# Patient Record
Sex: Female | Born: 1985 | Race: White | Hispanic: No | Marital: Married | Smoking: Never smoker
Health system: Southern US, Community
[De-identification: ages and names within clinical notes are randomized; demographics above are authoritative.]

## PROBLEM LIST (undated history)

## (undated) ENCOUNTER — Inpatient Hospital Stay (HOSPITAL_COMMUNITY): Payer: Self-pay

## (undated) DIAGNOSIS — Z789 Other specified health status: Secondary | ICD-10-CM

## (undated) DIAGNOSIS — Z8619 Personal history of other infectious and parasitic diseases: Secondary | ICD-10-CM

## (undated) HISTORY — PX: NO PAST SURGERIES: SHX2092

## (undated) HISTORY — DX: Personal history of other infectious and parasitic diseases: Z86.19

---

## 2007-09-11 ENCOUNTER — Emergency Department (HOSPITAL_COMMUNITY): Admission: EM | Admit: 2007-09-11 | Discharge: 2007-09-12 | Payer: Self-pay | Admitting: Emergency Medicine

## 2008-03-07 ENCOUNTER — Emergency Department (HOSPITAL_COMMUNITY): Admission: EM | Admit: 2008-03-07 | Discharge: 2008-03-07 | Payer: Self-pay | Admitting: Emergency Medicine

## 2010-05-22 LAB — HIV ANTIBODY (ROUTINE TESTING W REFLEX): HIV: NONREACTIVE

## 2010-11-12 ENCOUNTER — Encounter (HOSPITAL_COMMUNITY): Payer: Self-pay | Admitting: *Deleted

## 2010-11-12 ENCOUNTER — Inpatient Hospital Stay (HOSPITAL_COMMUNITY)
Admission: AD | Admit: 2010-11-12 | Discharge: 2010-11-12 | Disposition: A | Discharge status: Home Health | Payer: Medicaid Other | Source: Ambulatory Visit | Attending: Obstetrics and Gynecology | Admitting: Obstetrics and Gynecology

## 2010-11-12 DIAGNOSIS — O4700 False labor before 37 completed weeks of gestation, unspecified trimester: Secondary | ICD-10-CM | POA: Insufficient documentation

## 2010-11-12 DIAGNOSIS — O479 False labor, unspecified: Secondary | ICD-10-CM

## 2010-11-12 LAB — URINALYSIS, ROUTINE W REFLEX MICROSCOPIC
Glucose, UA: NEGATIVE mg/dL
Ketones, ur: NEGATIVE mg/dL
Leukocytes, UA: NEGATIVE
Protein, ur: NEGATIVE mg/dL

## 2010-11-12 MED ORDER — TERBUTALINE SULFATE 1 MG/ML IJ SOLN
0.2500 mg | Freq: Once | INTRAMUSCULAR | Status: AC
  Administered 2010-11-12: 0.25 mg via SUBCUTANEOUS
  Filled 2010-11-12: qty 1

## 2010-11-12 MED ORDER — BETAMETHASONE SOD PHOS & ACET 6 (3-3) MG/ML IJ SUSP
12.0000 mg | Freq: Once | INTRAMUSCULAR | Status: AC
  Administered 2010-11-12: 12 mg via INTRAMUSCULAR
  Filled 2010-11-12: qty 2

## 2010-11-12 MED ORDER — NIFEDIPINE 10 MG PO CAPS: 10.0000 mg | ORAL_CAPSULE | Freq: Four times a day (QID) | ORAL | Status: DC | PRN

## 2010-11-12 NOTE — ED Provider Notes (Signed)
History     Chief Complaint  Patient presents with  . Contractions   HPI [name] 24 y.o. was seen in the office yesterday and was having some contractions.  Was told to come to MAU if contractions worsened.    OB History    Grav Para Term Preterm Abortions TAB SAB Ect Mult Living   1               No past medical history on file.  No past surgical history on file.  No family history on file.  History  Substance Use Topics  . Smoking status: Not on file  . Smokeless tobacco: Not on file  . Alcohol Use: Not on file    Allergies:  Allergies  Allergen Reactions  . Shellfish Allergy Nausea And Vomiting    Prescriptions prior to admission  Medication Sig Dispense Refill  . prenatal vitamin w/FE, FA (PRENATAL 1 + 1) 27-1 MG TABS Take 1 tablet by mouth daily.          Review of Systems  Gastrointestinal: Positive for abdominal pain.  Genitourinary:       No vaginal discharge. No vaginal bleeding. No dysuria.    Physical Exam   Blood pressure 111/78, pulse 91, resp. rate 18, height 5\' 2"  (1.575 m), weight 155 lb (70.308 kg).  Physical Exam  Nursing note and vitals reviewed. Constitutional: She is oriented to person, place, and time. She appears well-developed and well-nourished.  HENT:  Head: Normocephalic.  Eyes: EOM are normal.  Neck: Neck supple.  GI: Soft. There is tenderness. There is no rebound and no guarding.       Contractions 4-7 min.  40 sec.  FHT 140. With 10x10 accels  Genitourinary:       Speculum exam: Vagina - Small amount of creamy discharge, no odor Cervix - No contact bleeding Bimanual exam: Cervix closed, thick, soft, presenting part ballotable Uterus non tender, gravid, size appropriate for dates Adnexa non tender, no masses bilaterally FFN done Chaperone present for exam.    Musculoskeletal: Normal range of motion.  Neurological: She is alert and oriented to person, place, and time.  Skin: Skin is warm and dry.    Psychiatric: She has a normal mood and affect.   Results for orders placed during the hospital encounter of 11/12/10 (from the past 24 hour(s))  URINALYSIS, ROUTINE W REFLEX MICROSCOPIC     Status: Normal   Collection Time   11/12/10  5:05 AM      Component Value Range   Color, Urine YELLOW  YELLOW    Appearance CLEAR  CLEAR    Specific Gravity, Urine 1.015  1.005 - 1.030    pH 6.5  5.0 - 8.0    Glucose, UA NEGATIVE  NEGATIVE (mg/dL)   Hgb urine dipstick NEGATIVE  NEGATIVE    Bilirubin Urine NEGATIVE  NEGATIVE    Ketones, ur NEGATIVE  NEGATIVE (mg/dL)   Protein, ur NEGATIVE  NEGATIVE (mg/dL)   Urobilinogen, UA 0.2  0.0 - 1.0 (mg/dL)   Nitrite NEGATIVE  NEGATIVE    Leukocytes, UA NEGATIVE  NEGATIVE   FETAL FIBRONECTIN     Status: Abnormal   Collection Time   11/12/10  5:48 AM      Component Value Range   Fetal Fibronectin POSITIVE (*) NEGATIVE     MAU Course  Procedures Consult with Dr. Ambrose Mantle re: plan of care.  Terbutaline SQ and FFN ordered. MDM Contractions less painful and 7-10 min apart  after Terbutaline. Currently contractions occassional and client much more comfortable. Dr, Ambrose Mantle in and talked with client. Strip reviewed.  Reactive monitor strip.  Assessment and Plan  Preterm contractions Positive FFN  Plan: Betamethasone IM now and in 24 hours Procardia 10 mg PO q 6 h Return to MAU in AM for Betamethasone injection Return sooner for worsening contractions.   BURLESON,TERRI 11/12/2010, 5:51 AM

## 2010-11-12 NOTE — Progress Notes (Signed)
DENIES HSV AND MRSA.  . WENT TO DR OFFICE YESTERDAY- WAS CRAMPING  TOLD HER IT WAS UC-  AT 0100- BECAME  WORSE. Marland Kitchen NO VE IN OFFICE .

## 2010-11-12 NOTE — Progress Notes (Signed)
SPEC EXAM- COLLECTED FFN.

## 2010-11-12 NOTE — ED Notes (Signed)
Dr. Henley into see pt. 

## 2010-11-12 NOTE — Progress Notes (Signed)
Pt reports having contractions since yesterday, seen in md's office yesterday for regular visit and was having contractions then and was told to come to hospital if this continued. Denies bleeding. Some discomfort with urination.

## 2010-11-13 ENCOUNTER — Inpatient Hospital Stay (HOSPITAL_COMMUNITY)
Admission: AD | Admit: 2010-11-13 | Discharge: 2010-11-13 | Disposition: A | Discharge status: Home / Self Care | Payer: Medicaid Other | Source: Ambulatory Visit | Attending: Obstetrics and Gynecology | Admitting: Obstetrics and Gynecology

## 2010-11-13 ENCOUNTER — Encounter (HOSPITAL_COMMUNITY): Payer: Self-pay

## 2010-11-13 DIAGNOSIS — O4700 False labor before 37 completed weeks of gestation, unspecified trimester: Secondary | ICD-10-CM | POA: Insufficient documentation

## 2010-11-13 DIAGNOSIS — O479 False labor, unspecified: Secondary | ICD-10-CM

## 2010-11-13 HISTORY — DX: Other specified health status: Z78.9

## 2010-11-13 MED ORDER — BETAMETHASONE SOD PHOS & ACET 6 (3-3) MG/ML IJ SUSP
12.0000 mg | Freq: Once | INTRAMUSCULAR | Status: AC
Start: 2010-11-13 — End: 2010-11-13
  Administered 2010-11-13: 12 mg via INTRAMUSCULAR
  Filled 2010-11-13: qty 2

## 2010-11-13 NOTE — ED Provider Notes (Signed)
History     Chief Complaint  Patient presents with  . Back Pain   HPIShadieh Alvarado,24 y.o. patient of Dr. Emeline Darling presents with back ache.  Called Dr. Ellyn Hack and instructed to come here for evaluation.  Had Betasone yesterday and again this morning.  Now 32 weeks pregnancy.  G1.  +Fetal movement.  Denies vaginal bleeding or leaking of fluid.     Is taking Procardia q6hr.  Had + FFN yesterday.  Past Medical History  Diagnosis Date  . No pertinent past medical history     Past Surgical History  Procedure Date  . No past surgeries     No family history on file.  History  Substance Use Topics  . Smoking status: Never Smoker   . Smokeless tobacco: Not on file  . Alcohol Use: No    Allergies:  Allergies  Allergen Reactions  . Shellfish Allergy Nausea And Vomiting    Prescriptions prior to admission  Medication Sig Dispense Refill  . NIFEdipine (PROCARDIA) 10 MG capsule Take 1 capsule (10 mg total) by mouth every 6 (six) hours as needed. For contractions  30 capsule  1  . NIFEdipine (PROCARDIA) 10 MG capsule Take 10 mg by mouth 3 (three) times daily as needed. For contractions       . prenatal vitamin w/FE, FA (PRENATAL 1 + 1) 27-1 MG TABS Take 1 tablet by mouth daily.          ROS Physical Exam   Blood pressure 117/75, pulse 101, temperature 98.5 F (36.9 C), temperature source Oral, resp. rate 16, SpO2 100.00%.  Physical Exam  Constitutional: She is oriented to person, place, and time. She appears well-developed and well-nourished.  Neck: Normal range of motion.  Respiratory: Effort normal.  Genitourinary:       Cervical exam by Denny Peon, Rn 1cm dilated, 25% and ballotable  Neurological: She is oriented to person, place, and time.        MAU Course  Procedures  MDM FMS  Reactive.  2 contractions in 1 hour.    Reported these findings to Dr. Ellyn Hack at15:20.  Order given to check cervix and if fingertip or less may discharge to home with increase po fluids and  preterm labor precautions.  15:30 reported cervical findings to Dr. Marikay Alar was in a delivery.  She called back at 16:45, cx exam reported by Denny Peon, RN.  Per Dr. Ellyn Hack she could be discharged to home.   Assessment and Plan  Braxton Hicks contractions at [redacted] weeks pregnant +FFN in office yesterday, hx of Betamethasone Inj X 2  Plan  Discharged with instructions to stay well hydrated, call Dr. Ellyn Hack if your contractions are 5 minutes apart, leaking of fluid, vaginal bleeding or decreased fetal movement.   Preterm labor instructions given at time of discharge.   KEY,EVE M 11/13/2010, 2:46 PM   Matt Holmes, NP 11/13/10 1655

## 2010-11-13 NOTE — Progress Notes (Signed)
Pt state she has been having back pain, was seen in MAU 8-29 with preterm contractions. Had a POS FfN and is taking Procardia prn. Was given her 2nd dose of Betamethasone this am.

## 2010-12-04 LAB — STREP B DNA PROBE: GBS: NEGATIVE

## 2010-12-19 LAB — DIFFERENTIAL
Basophils Absolute: 0 10*3/uL (ref 0.0–0.1)
Basophils Relative: 0 % (ref 0–1)
Eosinophils Absolute: 0 10*3/uL (ref 0.0–0.7)
Eosinophils Relative: 0 % (ref 0–5)
Monocytes Absolute: 0.2 10*3/uL (ref 0.1–1.0)
Neutro Abs: 12.5 10*3/uL — ABNORMAL HIGH (ref 1.7–7.7)

## 2010-12-19 LAB — POCT I-STAT, CHEM 8
Calcium, Ion: 1.11 mmol/L — ABNORMAL LOW (ref 1.12–1.32)
HCT: 42 % (ref 36.0–46.0)
TCO2: 18 mmol/L (ref 0–100)

## 2010-12-19 LAB — CBC
HCT: 41.3 % (ref 36.0–46.0)
Hemoglobin: 14 g/dL (ref 12.0–15.0)
MCHC: 34 g/dL (ref 30.0–36.0)
RDW: 13.1 % (ref 11.5–15.5)

## 2010-12-27 ENCOUNTER — Inpatient Hospital Stay (HOSPITAL_COMMUNITY)
Admission: AD | Admit: 2010-12-27 | Discharge: 2010-12-27 | Disposition: A | Discharge status: Home / Self Care | Payer: Medicaid Other | Source: Ambulatory Visit | Attending: Obstetrics and Gynecology | Admitting: Obstetrics and Gynecology

## 2010-12-27 ENCOUNTER — Encounter (HOSPITAL_COMMUNITY): Payer: Self-pay | Admitting: *Deleted

## 2010-12-27 DIAGNOSIS — O479 False labor, unspecified: Secondary | ICD-10-CM | POA: Insufficient documentation

## 2010-12-27 NOTE — Progress Notes (Signed)
G1 at 38.5 wks. Ctxs since 0100 which have gotten closer and stronger. Denies leaking or bleeding

## 2010-12-28 ENCOUNTER — Encounter (HOSPITAL_COMMUNITY): Payer: Self-pay | Admitting: *Deleted

## 2010-12-28 ENCOUNTER — Inpatient Hospital Stay (HOSPITAL_COMMUNITY)
Admission: AD | Admit: 2010-12-28 | Discharge: 2010-12-30 | DRG: 775 | Disposition: A | Discharge status: Home / Self Care | Payer: Medicaid Other | Source: Ambulatory Visit | Attending: Obstetrics and Gynecology | Admitting: Obstetrics and Gynecology

## 2010-12-28 ENCOUNTER — Inpatient Hospital Stay (HOSPITAL_COMMUNITY): Payer: Medicaid Other | Admitting: Anesthesiology

## 2010-12-28 ENCOUNTER — Encounter (HOSPITAL_COMMUNITY): Payer: Self-pay | Admitting: Anesthesiology

## 2010-12-28 LAB — CBC
Hemoglobin: 13.4 g/dL (ref 12.0–15.0)
MCH: 32 pg (ref 26.0–34.0)
MCV: 90.9 fL (ref 78.0–100.0)
RBC: 4.19 MIL/uL (ref 3.87–5.11)
WBC: 11.5 10*3/uL — ABNORMAL HIGH (ref 4.0–10.5)

## 2010-12-28 MED ORDER — OXYCODONE-ACETAMINOPHEN 5-325 MG PO TABS: 2.0000 | ORAL_TABLET | ORAL | Status: DC | PRN

## 2010-12-28 MED ORDER — BENZOCAINE-MENTHOL 20-0.5 % EX AERO
INHALATION_SPRAY | CUTANEOUS | Status: AC
  Filled 2010-12-28: qty 56

## 2010-12-28 MED ORDER — LIDOCAINE HCL 1.5 % IJ SOLN
INTRAMUSCULAR | Status: DC | PRN
  Administered 2010-12-28 (×2): 5 mL via INTRADERMAL

## 2010-12-28 MED ORDER — LACTATED RINGERS IV SOLN: 500.0000 mL | INTRAVENOUS | Status: DC | PRN

## 2010-12-28 MED ORDER — FENTANYL 2.5 MCG/ML BUPIVACAINE 1/10 % EPIDURAL INFUSION (WH - ANES)
14.0000 mL/h | INTRAMUSCULAR | Status: DC
  Administered 2010-12-28 (×3): 14 mL/h via EPIDURAL
  Filled 2010-12-28 (×2): qty 60

## 2010-12-28 MED ORDER — DIBUCAINE 1 % RE OINT: 1.0000 "application " | TOPICAL_OINTMENT | RECTAL | Status: DC | PRN

## 2010-12-28 MED ORDER — PHENYLEPHRINE 40 MCG/ML (10ML) SYRINGE FOR IV PUSH (FOR BLOOD PRESSURE SUPPORT): 80.0000 ug | PREFILLED_SYRINGE | INTRAVENOUS | Status: DC | PRN

## 2010-12-28 MED ORDER — LACTATED RINGERS IV SOLN
500.0000 mL | Freq: Once | INTRAVENOUS | Status: AC
  Administered 2010-12-28: 500 mL via INTRAVENOUS

## 2010-12-28 MED ORDER — FLEET ENEMA 7-19 GM/118ML RE ENEM: 1.0000 | ENEMA | RECTAL | Status: DC | PRN

## 2010-12-28 MED ORDER — EPHEDRINE 5 MG/ML INJ
INTRAVENOUS | Status: AC
  Administered 2010-12-28: 10 mg via INTRAVENOUS
  Filled 2010-12-28: qty 4

## 2010-12-28 MED ORDER — EPHEDRINE 5 MG/ML INJ
10.0000 mg | INTRAVENOUS | Status: DC | PRN
Start: 2010-12-28 — End: 2010-12-28
  Administered 2010-12-28: 10 mg via INTRAVENOUS

## 2010-12-28 MED ORDER — BENZOCAINE-MENTHOL 20-0.5 % EX AERO: 1.0000 "application " | INHALATION_SPRAY | CUTANEOUS | Status: DC | PRN

## 2010-12-28 MED ORDER — IBUPROFEN 600 MG PO TABS
600.0000 mg | ORAL_TABLET | Freq: Four times a day (QID) | ORAL | Status: DC | PRN
  Administered 2010-12-28: 600 mg via ORAL
  Filled 2010-12-28 (×2): qty 1

## 2010-12-28 MED ORDER — LIDOCAINE HCL (PF) 1 % IJ SOLN
30.0000 mL | INTRAMUSCULAR | Status: DC | PRN
  Administered 2010-12-28: 30 mL via SUBCUTANEOUS
  Filled 2010-12-28: qty 30

## 2010-12-28 MED ORDER — SIMETHICONE 80 MG PO CHEW: 80.0000 mg | CHEWABLE_TABLET | ORAL | Status: DC | PRN

## 2010-12-28 MED ORDER — ONDANSETRON HCL 4 MG/2ML IJ SOLN: 4.0000 mg | Freq: Four times a day (QID) | INTRAMUSCULAR | Status: DC | PRN

## 2010-12-28 MED ORDER — LANOLIN HYDROUS EX OINT: TOPICAL_OINTMENT | CUTANEOUS | Status: DC | PRN

## 2010-12-28 MED ORDER — ACETAMINOPHEN 325 MG PO TABS: 650.0000 mg | ORAL_TABLET | ORAL | Status: DC | PRN

## 2010-12-28 MED ORDER — DIPHENHYDRAMINE HCL 50 MG/ML IJ SOLN: 12.5000 mg | INTRAMUSCULAR | Status: DC | PRN

## 2010-12-28 MED ORDER — DIPHENHYDRAMINE HCL 25 MG PO CAPS: 25.0000 mg | ORAL_CAPSULE | Freq: Four times a day (QID) | ORAL | Status: DC | PRN

## 2010-12-28 MED ORDER — TERBUTALINE SULFATE 1 MG/ML IJ SOLN: 0.2500 mg | Freq: Once | INTRAMUSCULAR | Status: DC | PRN

## 2010-12-28 MED ORDER — MEASLES, MUMPS & RUBELLA VAC ~~LOC~~ INJ
0.5000 mL | INJECTION | Freq: Once | SUBCUTANEOUS | Status: DC
  Filled 2010-12-28: qty 0.5

## 2010-12-28 MED ORDER — OXYTOCIN BOLUS FROM INFUSION
500.0000 mL | Freq: Once | INTRAVENOUS | Status: DC
  Filled 2010-12-28: qty 1000
  Filled 2010-12-28: qty 500

## 2010-12-28 MED ORDER — PRENATAL PLUS 27-1 MG PO TABS
1.0000 | ORAL_TABLET | Freq: Every day | ORAL | Status: DC
  Administered 2010-12-29 – 2010-12-30 (×2): 1 via ORAL
  Filled 2010-12-28 (×2): qty 1

## 2010-12-28 MED ORDER — ONDANSETRON HCL 4 MG/2ML IJ SOLN: 4.0000 mg | INTRAMUSCULAR | Status: DC | PRN

## 2010-12-28 MED ORDER — LACTATED RINGERS IV SOLN
INTRAVENOUS | Status: DC
  Administered 2010-12-28: 125 mL/h via INTRAVENOUS
  Administered 2010-12-28: 125 mL via INTRAVENOUS
  Administered 2010-12-28: 13:00:00 via INTRAVENOUS

## 2010-12-28 MED ORDER — WITCH HAZEL-GLYCERIN EX PADS: 1.0000 "application " | MEDICATED_PAD | CUTANEOUS | Status: DC | PRN

## 2010-12-28 MED ORDER — PHENYLEPHRINE 40 MCG/ML (10ML) SYRINGE FOR IV PUSH (FOR BLOOD PRESSURE SUPPORT)
PREFILLED_SYRINGE | INTRAVENOUS | Status: AC
  Administered 2010-12-28: 200 ug via INTRAVENOUS
  Filled 2010-12-28: qty 5

## 2010-12-28 MED ORDER — OXYTOCIN 20 UNITS IN LACTATED RINGERS INFUSION - SIMPLE
1.0000 m[IU]/min | INTRAVENOUS | Status: DC
  Administered 2010-12-28: 1 m[IU]/min via INTRAVENOUS
  Administered 2010-12-28: 8 m[IU]/min via INTRAVENOUS

## 2010-12-28 MED ORDER — FENTANYL 2.5 MCG/ML BUPIVACAINE 1/10 % EPIDURAL INFUSION (WH - ANES)
INTRAMUSCULAR | Status: AC
  Administered 2010-12-28: 14 mL/h via EPIDURAL
  Filled 2010-12-28: qty 60

## 2010-12-28 MED ORDER — CITRIC ACID-SODIUM CITRATE 334-500 MG/5ML PO SOLN: 30.0000 mL | ORAL | Status: DC | PRN

## 2010-12-28 MED ORDER — TETANUS-DIPHTH-ACELL PERTUSSIS 5-2.5-18.5 LF-MCG/0.5 IM SUSP
0.5000 mL | Freq: Once | INTRAMUSCULAR | Status: AC
  Administered 2010-12-29: 0.5 mL via INTRAMUSCULAR
  Filled 2010-12-28: qty 0.5

## 2010-12-28 MED ORDER — IBUPROFEN 600 MG PO TABS
600.0000 mg | ORAL_TABLET | Freq: Four times a day (QID) | ORAL | Status: DC
  Administered 2010-12-29 – 2010-12-30 (×5): 600 mg via ORAL
  Filled 2010-12-28 (×4): qty 1

## 2010-12-28 MED ORDER — OXYTOCIN 20 UNITS IN LACTATED RINGERS INFUSION - SIMPLE
125.0000 mL/h | Freq: Once | INTRAVENOUS | Status: DC
  Administered 2010-12-28: 125 mL/h via INTRAVENOUS

## 2010-12-28 MED ORDER — SENNOSIDES-DOCUSATE SODIUM 8.6-50 MG PO TABS
2.0000 | ORAL_TABLET | Freq: Every day | ORAL | Status: DC
  Administered 2010-12-29: 2 via ORAL

## 2010-12-28 MED ORDER — ZOLPIDEM TARTRATE 5 MG PO TABS: 5.0000 mg | ORAL_TABLET | Freq: Every evening | ORAL | Status: DC | PRN

## 2010-12-28 MED ORDER — PHENYLEPHRINE 40 MCG/ML (10ML) SYRINGE FOR IV PUSH (FOR BLOOD PRESSURE SUPPORT)
80.0000 ug | PREFILLED_SYRINGE | INTRAVENOUS | Status: DC | PRN
  Administered 2010-12-28: 200 ug via INTRAVENOUS

## 2010-12-28 MED ORDER — OXYCODONE-ACETAMINOPHEN 5-325 MG PO TABS
1.0000 | ORAL_TABLET | Freq: Four times a day (QID) | ORAL | Status: DC | PRN
  Administered 2010-12-29 (×2): 2 via ORAL
  Filled 2010-12-28 (×2): qty 2

## 2010-12-28 MED ORDER — ONDANSETRON HCL 4 MG PO TABS: 4.0000 mg | ORAL_TABLET | ORAL | Status: DC | PRN

## 2010-12-28 NOTE — Anesthesia Preprocedure Evaluation (Signed)
Anesthesia Evaluation  Name, MR# and DOB Patient awake  General Assessment Comment  Reviewed: Allergy & Precautions, H&P , Patient's Chart, lab work & pertinent test results  Airway Mallampati: II TM Distance: >3 FB Neck ROM: full    Dental No notable dental hx.    Pulmonary  clear to auscultation  Pulmonary exam normal       Cardiovascular regular Normal    Neuro/Psych Negative Neurological ROS  Negative Psych ROS   GI/Hepatic negative GI ROS Neg liver ROS    Endo/Other  Negative Endocrine ROS  Renal/GU negative Renal ROS     Musculoskeletal   Abdominal   Peds  Hematology negative hematology ROS (+)   Anesthesia Other Findings   Reproductive/Obstetrics (+) Pregnancy                           Anesthesia Physical Anesthesia Plan  ASA: II  Anesthesia Plan: Epidural   Post-op Pain Management:    Induction:   Airway Management Planned:   Additional Equipment:   Intra-op Plan:   Post-operative Plan:   Informed Consent: I have reviewed the patients History and Physical, chart, labs and discussed the procedure including the risks, benefits and alternatives for the proposed anesthesia with the patient or authorized representative who has indicated his/her understanding and acceptance.     Plan Discussed with:   Anesthesia Plan Comments:         Anesthesia Quick Evaluation  

## 2010-12-28 NOTE — Progress Notes (Signed)
Pt having ctx since  4am very uncomfortable

## 2010-12-28 NOTE — Progress Notes (Signed)
Pt reports strong UC's  q 4-8 since 0400

## 2010-12-28 NOTE — Op Note (Signed)
Delivery note on Carmelita  [place]:  The patient progressed to full dilatation with a vertex OA and a +1 station. She pushed well and delivered a living female infant over an intact perineum. Apgars of the infant were 9 at one and 9 at 5 minutes. The placenta was removed intact and the uterus was free of any products of conception. The uterus contracted well. There was a periurethral laceration superior to the clitoris that was bleeding and was sutured with a figure-of-eight suture of 3-0 Vicryl. The urethra was prepped with Betadine and a [place] catheter was placed into the urethra to make sure the suture had not compromised the urethral lumen. Estimated blood loss 400 cc

## 2010-12-28 NOTE — H&P (Signed)
Dr. Ambrose Mantle dictating admission history and physical exam on [name]:  This is a 25 year old [place] female para 0 gravida 1 EDC 01/07/2011 by an ultrasound at [redacted] weeks gestation done on 06/06/2010 who is admitted to the hospital in early labor.  Blood group and type A+ negative antibody Pap smear normal rubella immune RPR nonreactive urine culture negative hepatitis B surface antigen negative HIV negative GC and Chlamydia negative hemoglobin electrophoresis AA one hour Glucola normal prior to arriving in our practice group B strep negative first trimester screen negative  The patient transferred to our practice at [redacted] weeks gestation since that time she has not had any major pregnancy complications. She began contracting at 4 AM today and came to the hospital at approximately 11 AM. She was thought to be 4 cm dilated and she was admitted and has received an epidural. She made no progress, her contractions spaced out, and she was started on Pitocin. She had spontaneous rupture of membranes at approximately 5:30 AM and at 6:20  PM the cervix was 6 cm dilated. There have been no major decelerations of the fetal heart rate.  Past medical history allergies: No drug allergies no latex allergy nausea from seafood in 2010.  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 Family history: None listed  Physical exam: Blood pressure 108/62 temperature 98.0 pulse 92 respirations 16  Heart: Normal sinus rhythm, no murmurs.  Lungs: Clear to auscultation. Abdomen: Soft fundal height 35.5 cm on 12/19/2010 fetal heart tones normal  Cervix: 6 cm 100% vertex at -1 station by the nurse at 6:30 PM  Impression: Intrauterine pregnancy 38 weeks and 4 days  Active labor  Disposition: The patient is on Pitocin and her progress in labor will be monitored

## 2010-12-28 NOTE — Anesthesia Procedure Notes (Signed)

## 2010-12-29 LAB — CBC
HCT: 32.5 % — ABNORMAL LOW (ref 36.0–46.0)
MCH: 32 pg (ref 26.0–34.0)
MCV: 92.1 fL (ref 78.0–100.0)
RBC: 3.53 MIL/uL — ABNORMAL LOW (ref 3.87–5.11)
WBC: 11.3 10*3/uL — ABNORMAL HIGH (ref 4.0–10.5)

## 2010-12-29 NOTE — Progress Notes (Signed)
#  1 afebrile BP normal HGB stable. No complaints.

## 2010-12-29 NOTE — Progress Notes (Signed)
UR chart review completed.  

## 2010-12-29 NOTE — Anesthesia Postprocedure Evaluation (Signed)
  Anesthesia Post-op Note  Patient: [name]  Procedure(s) Performed: * No procedures listed *  Patient Location: Mother/Baby  Anesthesia Type: Epidural  Level of Consciousness: awake, alert , oriented and patient cooperative  Airway and Oxygen Therapy: Patient Spontanous Breathing  Post-op Pain: none  Post-op Assessment: Patient's Cardiovascular Status Stable, Respiratory Function Stable, Patent Airway, No signs of Nausea or vomiting, Adequate PO intake and Pain level controlled  Post-op Vital Signs: Reviewed and stable  Complications: No apparent anesthesia complications

## 2010-12-30 NOTE — Discharge Summary (Signed)
[name], SCHUFF            ACCOUNT NO.:  [id number]  MEDICAL RECORD NO.:  [id number]  LOCATION:  9144                          FACILITY:  [facility]  PHYSICIAN:  Malachi Pro. Ambrose Mantle, M.D. DATE OF BIRTH:  1985-04-19  DATE OF ADMISSION:  12/28/2010 DATE OF DISCHARGE:  12/30/2010                              DISCHARGE SUMMARY   HISTORY:  A 25 year old [place] female, para 0, gravida 1, EDC January 07, 2011, 9 week ultrasound, admitted in early labor.  Blood group and type A positive, negative antibody, Pap smear normal.  Rubella immune.  RPR nonreactive.  Urine culture negative.  Hepatitis B surface antigen and HIV negative.  GC and Chlamydia negative.  Hemoglobin electrophoresis AA, 1 hour Glucola normal, prior to arriving in our practice group B strep negative, and first trimester screen negative. The patient presented at our practice at 27 weeks' gestation since that time she had no major pregnancy complications.  She was taking Procardia on a p.r.n. basis for contractions.  Her contractions began on the morning of admission, she presented to Maternity Admission Unit later in the morning.  She was thought to be 4 cm dilated and was admitted.  She received an epidural and made no progress.  She was started on Pitocin. She had spontaneous rupture of membranes at 5:30 p.m. And the cervix was 6 cm dilated.  The patient progressed to full dilatation with the vertex OA in a +1 station.  She pushed well and delivered a living female infant over an intact perineum.  Apgars of the infant were 9 at 1 and 9 at 5 minutes.  The placenta was removed intact.  The uterus was free of any products of conception.  The uterus contracted well.  There was a periurethral laceration superior to the clitoris that was bleeding and was sutured with a figure-of-eight suture of 3-0 Vicryl.  Urethra was prepped with Betadine and a [place] catheter was placed into the urethra to make sure the suture did not  compromise the urethral lumen.  Blood loss was about 400 mL.  Postpartum, the patient did well and was discharged on the 2nd postpartum day.  Initial hemoglobin was 13.4, hematocrit 38.1, white count 11,500, platelet count 209,000.  Followup hemoglobin 11.3.  FINAL DIAGNOSIS:  Intrauterine pregnancy at 38+ weeks, delivered vertex.  OPERATION:  Spontaneous delivery of vertex, repair of periurethral laceration.  FINAL CONDITION:  Improved.  INSTRUCTIONS:  Our regular discharge instruction booklet.  The patient declines analgesics.  At discharge, she states she can take Motrin over- the-counter.  She is advised to return to the office in 6 weeks for followup examination.     Malachi Pro. Ambrose Mantle, M.D.     TFH/MEDQ  D:  12/30/2010  T:  12/30/2010  Job:  [id number]

## 2010-12-30 NOTE — Progress Notes (Signed)
#  2 afebrile BP normal for discharge

## 2011-06-11 ENCOUNTER — Emergency Department (INDEPENDENT_AMBULATORY_CARE_PROVIDER_SITE_OTHER): Payer: Medicaid Other

## 2011-06-11 ENCOUNTER — Encounter (HOSPITAL_COMMUNITY): Payer: Self-pay

## 2011-06-11 ENCOUNTER — Emergency Department (INDEPENDENT_AMBULATORY_CARE_PROVIDER_SITE_OTHER)
Admission: EM | Admit: 2011-06-11 | Discharge: 2011-06-11 | Disposition: A | Discharge status: Home / Self Care | Payer: Medicaid Other | Source: Home / Self Care | Attending: Family Medicine | Admitting: Family Medicine

## 2011-06-11 DIAGNOSIS — M2241 Chondromalacia patellae, right knee: Secondary | ICD-10-CM

## 2011-06-11 DIAGNOSIS — M778 Other enthesopathies, not elsewhere classified: Secondary | ICD-10-CM

## 2011-06-11 DIAGNOSIS — M2240 Chondromalacia patellae, unspecified knee: Secondary | ICD-10-CM

## 2011-06-11 DIAGNOSIS — M65839 Other synovitis and tenosynovitis, unspecified forearm: Secondary | ICD-10-CM

## 2011-06-11 MED ORDER — DICLOFENAC POTASSIUM 50 MG PO TABS: 50.0000 mg | ORAL_TABLET | Freq: Three times a day (TID) | ORAL | Status: DC

## 2011-06-11 NOTE — ED Notes (Signed)
Pt presents today with complaints of right hand  And knee pain that started 06/08/11. She has taken Advil 2 tablets since last night and got no relief. Pain is rated 6/10 and is worse with certain movement. Knee pain is worse when she walks or stands and is described as stabbing. Denies any previous physical accidents.

## 2011-06-11 NOTE — ED Provider Notes (Signed)
History     CSN: [id number]  Arrival date & time 06/11/11  1405   First MD Initiated Contact with Patient 06/11/11 1604      Chief Complaint  Patient presents with  . Hand Pain  . Knee Pain    (Consider location/radiation/quality/duration/timing/severity/associated sxs/prior treatment) Patient is a 26 y.o. female presenting with hand pain and knee pain. The history is provided by the patient.  Hand Pain This is a new problem. The current episode started more than 2 days ago (wrist/hand pain getting worse with no known injury or factors). The problem occurs constantly. The problem has been gradually worsening. The symptoms are aggravated by walking.  Knee Pain    Past Medical History  Diagnosis Date  . No pertinent past medical history     Past Surgical History  Procedure Date  . No past surgeries     No family history on file.  History  Substance Use Topics  . Smoking status: Never Smoker   . Smokeless tobacco: Never Used  . Alcohol Use: No    OB History    Grav Para Term Preterm Abortions TAB SAB Ect Mult Living   1 1 1  0 0 0 0 0 0 1      Review of Systems  Constitutional: Negative.   Musculoskeletal: Positive for joint swelling.    Allergies  Shellfish allergy  Home Medications   Current Outpatient Rx  Name Route Sig Dispense Refill  . DICLOFENAC POTASSIUM 50 MG PO TABS Oral Take 1 tablet (50 mg total) by mouth 3 (three) times daily. As needed for wrist and knee pains. 30 tablet 0  . PRENATAL PLUS 27-1 MG PO TABS Oral Take 1 tablet by mouth daily.       BP 111/70  Pulse 84  Temp(Src) 98.1 F (36.7 C) (Oral)  Resp 16  SpO2 100%  Breastfeeding? Yes  Physical Exam  Nursing note and vitals reviewed. Constitutional: She is oriented to person, place, and time. She appears well-developed and well-nourished.  Musculoskeletal: She exhibits tenderness.       Hands: Neurological: She is alert and oriented to person, place, and time.  Skin: Skin is  warm and dry.    ED Course  Procedures (including critical care time)  Labs Reviewed - No data to display Dg Wrist Complete Right  06/11/2011  *RADIOLOGY REPORT*  Clinical Data: Pain for 3 days.  No trauma.  RIGHT WRIST - COMPLETE 3+ VIEW  Comparison:  None.  Findings:  There is no evidence of fracture or dislocation.  There is no evidence of arthropathy or other focal bone abnormality. Soft tissues are unremarkable.  IMPRESSION: Negative.  Original Report Authenticated By: Elsie Stain, M.D.     1. Tendonitis of wrist, right   2. Chondromalacia of patella, right       MDM  X-rays reviewed and report per radiologist.         Linna Hoff, MD 06/11/11 [id number]

## 2011-06-11 NOTE — Discharge Instructions (Signed)
Wear brace for comfort and medicine as needed for hand and knee, see orthopedist if further problems. The x-rays were normal.

## 2011-06-11 NOTE — ED Notes (Signed)
r  Wrist  Splint  aplied

## 2011-12-01 ENCOUNTER — Other Ambulatory Visit: Payer: Self-pay

## 2011-12-01 LAB — OB RESULTS CONSOLE HIV ANTIBODY (ROUTINE TESTING): HIV: NONREACTIVE

## 2012-01-18 ENCOUNTER — Other Ambulatory Visit (HOSPITAL_COMMUNITY): Payer: Self-pay | Admitting: Obstetrics and Gynecology

## 2012-01-18 DIAGNOSIS — O283 Abnormal ultrasonic finding on antenatal screening of mother: Secondary | ICD-10-CM

## 2012-01-19 ENCOUNTER — Ambulatory Visit (HOSPITAL_COMMUNITY)
Admission: RE | Admit: 2012-01-19 | Discharge: 2012-01-19 | Disposition: A | Discharge status: Home / Self Care | Payer: Medicaid Other | Source: Ambulatory Visit | Attending: Obstetrics and Gynecology | Admitting: Obstetrics and Gynecology

## 2012-01-19 ENCOUNTER — Encounter (HOSPITAL_COMMUNITY): Payer: Self-pay

## 2012-01-19 DIAGNOSIS — O283 Abnormal ultrasonic finding on antenatal screening of mother: Secondary | ICD-10-CM

## 2012-01-19 DIAGNOSIS — Z1389 Encounter for screening for other disorder: Secondary | ICD-10-CM | POA: Insufficient documentation

## 2012-01-19 DIAGNOSIS — Z363 Encounter for antenatal screening for malformations: Secondary | ICD-10-CM | POA: Insufficient documentation

## 2012-01-19 DIAGNOSIS — O358XX0 Maternal care for other (suspected) fetal abnormality and damage, not applicable or unspecified: Secondary | ICD-10-CM | POA: Insufficient documentation

## 2012-01-22 LAB — TORCH-IGM(TOXO/ RUB/ CMV/ HSV) W TITER
CMV IgM: <0.2
HSV 1 IgM Abs: NEGATIVE
HSV 2 IgM Abs: NEGATIVE
Toxoplasma IgM: NEGATIVE

## 2012-01-25 ENCOUNTER — Telehealth (HOSPITAL_COMMUNITY): Payer: Self-pay | Admitting: *Deleted

## 2012-01-25 NOTE — Telephone Encounter (Signed)
Pt called to obtain her lab results from TORCH titers.  Results reviewed with Dr. Claudean Severance. Pt informed of normal results.  Reminded pt of appointment on 12/6.  Pt verbalized understanding.

## 2012-02-18 ENCOUNTER — Other Ambulatory Visit (HOSPITAL_COMMUNITY): Payer: Self-pay | Admitting: Maternal and Fetal Medicine

## 2012-02-18 DIAGNOSIS — O358XX0 Maternal care for other (suspected) fetal abnormality and damage, not applicable or unspecified: Secondary | ICD-10-CM

## 2012-02-19 ENCOUNTER — Ambulatory Visit (HOSPITAL_COMMUNITY)
Admission: RE | Admit: 2012-02-19 | Discharge: 2012-02-19 | Disposition: A | Discharge status: Home / Self Care | Payer: Medicaid Other | Source: Ambulatory Visit | Attending: Obstetrics and Gynecology | Admitting: Obstetrics and Gynecology

## 2012-02-19 DIAGNOSIS — O358XX0 Maternal care for other (suspected) fetal abnormality and damage, not applicable or unspecified: Secondary | ICD-10-CM | POA: Insufficient documentation

## 2012-02-19 DIAGNOSIS — O283 Abnormal ultrasonic finding on antenatal screening of mother: Secondary | ICD-10-CM

## 2012-02-19 NOTE — Progress Notes (Signed)
[name] was seen for ultrasound appointment today.  Please see [facility] report for details.

## 2012-03-18 ENCOUNTER — Ambulatory Visit (HOSPITAL_COMMUNITY)
Admission: RE | Admit: 2012-03-18 | Discharge: 2012-03-18 | Disposition: A | Discharge status: Home / Self Care | Payer: Medicaid Other | Source: Ambulatory Visit | Attending: Obstetrics and Gynecology | Admitting: Obstetrics and Gynecology

## 2012-03-18 VITALS — BP 109/64 | HR 85 | Wt 157.2 lb

## 2012-03-18 DIAGNOSIS — O283 Abnormal ultrasonic finding on antenatal screening of mother: Secondary | ICD-10-CM

## 2012-03-18 DIAGNOSIS — O358XX0 Maternal care for other (suspected) fetal abnormality and damage, not applicable or unspecified: Secondary | ICD-10-CM | POA: Insufficient documentation

## 2012-03-18 NOTE — Progress Notes (Signed)
[name]  was seen today for an ultrasound appointment.  See full report in [facility].  Impression: Single IUP at 27 1/7 weeks Follow up due to echogenic bowel - TORCH titers not consistent with accute infenction Interval growth is appropriate (63rd %tile) No findings suspcious for fetal viral infection noted Normal amniotic fluid volume  Recommendations: Recommend follow-up ultrasound examination in 4 weeks  Alpha Gula, MD

## 2012-04-15 ENCOUNTER — Ambulatory Visit (HOSPITAL_COMMUNITY)
Admission: RE | Admit: 2012-04-15 | Discharge: 2012-04-15 | Disposition: A | Discharge status: Home / Self Care | Payer: Medicaid Other | Source: Ambulatory Visit | Attending: Obstetrics and Gynecology | Admitting: Obstetrics and Gynecology

## 2012-04-15 DIAGNOSIS — O283 Abnormal ultrasonic finding on antenatal screening of mother: Secondary | ICD-10-CM

## 2012-04-15 DIAGNOSIS — O358XX0 Maternal care for other (suspected) fetal abnormality and damage, not applicable or unspecified: Secondary | ICD-10-CM | POA: Insufficient documentation

## 2012-05-12 ENCOUNTER — Other Ambulatory Visit (HOSPITAL_COMMUNITY): Payer: Self-pay | Admitting: Maternal and Fetal Medicine

## 2012-05-13 ENCOUNTER — Ambulatory Visit (HOSPITAL_COMMUNITY)
Admission: RE | Admit: 2012-05-13 | Discharge: 2012-05-13 | Disposition: A | Discharge status: Home / Self Care | Payer: Medicaid Other | Source: Ambulatory Visit | Attending: Obstetrics and Gynecology | Admitting: Obstetrics and Gynecology

## 2012-05-13 VITALS — BP 113/72 | HR 89 | Wt 171.5 lb

## 2012-05-13 DIAGNOSIS — IMO0001 Reserved for inherently not codable concepts without codable children: Secondary | ICD-10-CM

## 2012-05-13 DIAGNOSIS — O358XX0 Maternal care for other (suspected) fetal abnormality and damage, not applicable or unspecified: Secondary | ICD-10-CM | POA: Insufficient documentation

## 2012-05-13 NOTE — Progress Notes (Signed)
[name]  was seen today for an ultrasound appointment.  See full report in [facility].  Comments: Ms. Plemmons is being followed due to echogenic bowel.  TORCH titers have been completed and are negative for acute infection.   Impression: IUP at 35 1/7 weeks Normal interval anatomy; right renal pylectasis that was previously noted is not appreciated on today's study Subjectively low normal amniotic fluid volume Appropriate interval growth with EFW at the 61st %tile    Recommendations: Follow-up ultrasounds as clinically indicated.   Alpha Gula, MD

## 2012-06-05 ENCOUNTER — Encounter (HOSPITAL_COMMUNITY): Payer: Self-pay | Admitting: *Deleted

## 2012-06-05 ENCOUNTER — Inpatient Hospital Stay (HOSPITAL_COMMUNITY)
Admission: AD | Admit: 2012-06-05 | Discharge: 2012-06-05 | Disposition: A | Discharge status: Home / Self Care | Payer: Medicaid Other | Source: Ambulatory Visit | Attending: Obstetrics and Gynecology | Admitting: Obstetrics and Gynecology

## 2012-06-05 DIAGNOSIS — O479 False labor, unspecified: Secondary | ICD-10-CM | POA: Insufficient documentation

## 2012-06-05 HISTORY — DX: Other specified health status: Z78.9

## 2012-06-05 NOTE — H&P (Addendum)
[name] is a 27 y.o. female G2P1001 at 38+ weeks (EDD 06/16/12 by an 11 week [place]) presenting for regular contractions every 5 minutes.  Prenantal care complicated by early US findings of echogenis bowel and mild pylectasis, but pt had MFM f/u and these issues resolved on f/u scans and baby had normal growth.  No other significant issues  Maternal Medical History:  Reason for admission: Contractions.   Contractions: Onset was 3-5 hours ago.   Frequency: regular.   Perceived severity is moderate.    Fetal activity: Perceived fetal activity is normal.    Prenatal Complications - Diabetes: none.    OB History   Grav Para Term Preterm Abortions TAB SAB Ect Mult Living   2 1 1  0 0 0 0 0 0 1    2012 NSVD 38 weeks 8 lbs  Past Medical History  Diagnosis Date  . No pertinent past medical history   . Medical history non-contributory    Past Surgical History  Procedure Laterality Date  . No past surgeries     Family History: family history is not on file. Social History:  reports that she has never smoked. She has never used smokeless tobacco. She reports that she does not drink alcohol or use illicit drugs.   Prenatal Transfer Tool  Maternal Diabetes: No Genetic Screening: Normal Maternal Ultrasounds/Referrals: Abnormal:  Findings:   Fetal renal pyelectasis, Echogenic bowel Fetal Ultrasounds or other Referrals:  Referred to Materal Fetal Medicine  Maternal Substance Abuse:  No Significant Maternal Medications:  None Significant Maternal Lab Results:  None Other Comments:  Pt saw MFM for [place] findingd of echogenic bowel and pyelectasis.  The findings resolved on f/u scan and TORCH titres were negative  ROS  Dilation: 3 Effacement (%): 70 Station: -1 Exam by:: K.WIlson,RN Blood pressure 118/70, pulse 84, temperature 97.9 F (36.6 C), temperature source Oral, resp. rate 18, height 5\' 2"  (1.575 m), weight 79.47 kg (175 lb 3.2 oz). Maternal Exam:  Uterine Assessment:  Contraction strength is moderate.  Contraction frequency is regular.   Abdomen: Patient reports no abdominal tenderness. Fetal presentation: vertex  Introitus: Normal vulva. Normal vagina.    Physical Exam  Constitutional: She is oriented to person, place, and time. She appears well-developed and well-nourished.  Cardiovascular: Normal rate and regular rhythm.   Respiratory: Effort normal and breath sounds normal.  GI: Soft.  Genitourinary: Vagina normal and uterus normal.  Neurological: She is alert and oriented to person, place, and time.  Psychiatric: Her behavior is normal.    Prenatal labs: ABO, Rh:   B positive  Antibody:  negative Rubella:  immune RPR:   NR HBsAg:   Neg HIV:   Neg GBS:   Neg One hour GCT WNL Hgb AA First trimester screen and AFP WNL  Assessment/Plan: Pt was observed for over an hour and made no cervical change.  Cervix was 3/70/-2.  She was offered further observation or d/c home.  She elected to go home and return if contractions worsen. NST reviewed and reactive.  Oliver Pila 06/05/2012, 2:20 PM

## 2012-06-05 NOTE — MAU Note (Signed)
Pt reports having ctx q 5 min since last night reports some bloody show no SROM . Good fetal movement reported and was almost 3cm in office last week.

## 2012-06-06 ENCOUNTER — Encounter (HOSPITAL_COMMUNITY): Payer: Self-pay | Admitting: Anesthesiology

## 2012-06-06 ENCOUNTER — Encounter (HOSPITAL_COMMUNITY): Payer: Self-pay

## 2012-06-06 ENCOUNTER — Inpatient Hospital Stay (HOSPITAL_COMMUNITY)
Admission: AD | Admit: 2012-06-06 | Discharge: 2012-06-08 | DRG: 775 | Disposition: A | Discharge status: Home / Self Care | Payer: Medicaid Other | Source: Ambulatory Visit | Attending: Obstetrics and Gynecology | Admitting: Obstetrics and Gynecology

## 2012-06-06 ENCOUNTER — Inpatient Hospital Stay (HOSPITAL_COMMUNITY): Payer: Medicaid Other | Admitting: Anesthesiology

## 2012-06-06 LAB — CBC
HCT: 36.3 % (ref 36.0–46.0)
RBC: 4.03 MIL/uL (ref 3.87–5.11)
RDW: 12.7 % (ref 11.5–15.5)
WBC: 10.3 10*3/uL (ref 4.0–10.5)

## 2012-06-06 LAB — OB RESULTS CONSOLE GBS: GBS: NEGATIVE

## 2012-06-06 MED ORDER — OXYTOCIN 40 UNITS IN LACTATED RINGERS INFUSION - SIMPLE MED
1.0000 m[IU]/min | INTRAVENOUS | Status: DC
  Administered 2012-06-06: 1 m[IU]/min via INTRAVENOUS

## 2012-06-06 MED ORDER — OXYTOCIN 40 UNITS IN LACTATED RINGERS INFUSION - SIMPLE MED: 62.5000 mL/h | INTRAVENOUS | Status: AC | PRN

## 2012-06-06 MED ORDER — BUTORPHANOL TARTRATE 1 MG/ML IJ SOLN
INTRAMUSCULAR | Status: AC
  Administered 2012-06-06: 08:00:00
  Filled 2012-06-06: qty 1

## 2012-06-06 MED ORDER — TETANUS-DIPHTH-ACELL PERTUSSIS 5-2.5-18.5 LF-MCG/0.5 IM SUSP: 0.5000 mL | Freq: Once | INTRAMUSCULAR | Status: DC

## 2012-06-06 MED ORDER — PRENATAL MULTIVITAMIN CH
1.0000 | ORAL_TABLET | Freq: Every day | ORAL | Status: DC
  Administered 2012-06-07: 1 via ORAL
  Filled 2012-06-06: qty 1

## 2012-06-06 MED ORDER — FENTANYL 2.5 MCG/ML BUPIVACAINE 1/10 % EPIDURAL INFUSION (WH - ANES)
14.0000 mL/h | INTRAMUSCULAR | Status: DC | PRN
  Administered 2012-06-06: 14 mL/h via EPIDURAL

## 2012-06-06 MED ORDER — ONDANSETRON HCL 4 MG PO TABS: 4.0000 mg | ORAL_TABLET | ORAL | Status: DC | PRN

## 2012-06-06 MED ORDER — ACETAMINOPHEN 325 MG PO TABS: 650.0000 mg | ORAL_TABLET | ORAL | Status: DC | PRN

## 2012-06-06 MED ORDER — ONDANSETRON HCL 4 MG/2ML IJ SOLN: 4.0000 mg | Freq: Four times a day (QID) | INTRAMUSCULAR | Status: DC | PRN

## 2012-06-06 MED ORDER — EPHEDRINE 5 MG/ML INJ
INTRAVENOUS | Status: AC
  Filled 2012-06-06: qty 4

## 2012-06-06 MED ORDER — LACTATED RINGERS IV SOLN: 500.0000 mL | Freq: Once | INTRAVENOUS | Status: DC

## 2012-06-06 MED ORDER — SENNOSIDES-DOCUSATE SODIUM 8.6-50 MG PO TABS
2.0000 | ORAL_TABLET | Freq: Every day | ORAL | Status: DC
  Administered 2012-06-06: 2 via ORAL

## 2012-06-06 MED ORDER — DIPHENHYDRAMINE HCL 50 MG/ML IJ SOLN: 12.5000 mg | INTRAMUSCULAR | Status: DC | PRN

## 2012-06-06 MED ORDER — OXYCODONE-ACETAMINOPHEN 5-325 MG PO TABS: 1.0000 | ORAL_TABLET | ORAL | Status: DC | PRN

## 2012-06-06 MED ORDER — BUTORPHANOL TARTRATE 1 MG/ML IJ SOLN: 1.0000 mg | Freq: Once | INTRAMUSCULAR | Status: DC

## 2012-06-06 MED ORDER — PHENYLEPHRINE 40 MCG/ML (10ML) SYRINGE FOR IV PUSH (FOR BLOOD PRESSURE SUPPORT)
PREFILLED_SYRINGE | INTRAVENOUS | Status: AC
  Filled 2012-06-06: qty 5

## 2012-06-06 MED ORDER — LIDOCAINE HCL (PF) 1 % IJ SOLN
INTRAMUSCULAR | Status: DC | PRN
  Administered 2012-06-06 (×4): 4 mL

## 2012-06-06 MED ORDER — OXYTOCIN BOLUS FROM INFUSION: 500.0000 mL | INTRAVENOUS | Status: DC

## 2012-06-06 MED ORDER — LIDOCAINE HCL (PF) 1 % IJ SOLN
30.0000 mL | INTRAMUSCULAR | Status: DC | PRN
  Filled 2012-06-06: qty 30

## 2012-06-06 MED ORDER — OXYTOCIN 40 UNITS IN LACTATED RINGERS INFUSION - SIMPLE MED
62.5000 mL/h | INTRAVENOUS | Status: DC
  Filled 2012-06-06: qty 1000

## 2012-06-06 MED ORDER — LACTATED RINGERS IV SOLN: INTRAVENOUS | Status: AC

## 2012-06-06 MED ORDER — LACTATED RINGERS IV SOLN: 500.0000 mL | INTRAVENOUS | Status: DC | PRN

## 2012-06-06 MED ORDER — DIBUCAINE 1 % RE OINT: 1.0000 "application " | TOPICAL_OINTMENT | RECTAL | Status: DC | PRN

## 2012-06-06 MED ORDER — CITRIC ACID-SODIUM CITRATE 334-500 MG/5ML PO SOLN: 30.0000 mL | ORAL | Status: DC | PRN

## 2012-06-06 MED ORDER — LANOLIN HYDROUS EX OINT: TOPICAL_OINTMENT | CUTANEOUS | Status: DC | PRN

## 2012-06-06 MED ORDER — DIPHENHYDRAMINE HCL 25 MG PO CAPS: 25.0000 mg | ORAL_CAPSULE | Freq: Four times a day (QID) | ORAL | Status: DC | PRN

## 2012-06-06 MED ORDER — OXYCODONE-ACETAMINOPHEN 5-325 MG PO TABS
1.0000 | ORAL_TABLET | ORAL | Status: DC | PRN
Start: 2012-06-06 — End: 2012-06-08
  Administered 2012-06-06 – 2012-06-07 (×3): 1 via ORAL
  Filled 2012-06-06 (×3): qty 1

## 2012-06-06 MED ORDER — LACTATED RINGERS IV SOLN
INTRAVENOUS | Status: DC
  Administered 2012-06-06 (×2): via INTRAVENOUS

## 2012-06-06 MED ORDER — EPHEDRINE 5 MG/ML INJ
10.0000 mg | INTRAVENOUS | Status: DC | PRN
  Administered 2012-06-06: 10 mg via INTRAVENOUS
  Filled 2012-06-06: qty 2

## 2012-06-06 MED ORDER — BENZOCAINE-MENTHOL 20-0.5 % EX AERO
1.0000 "application " | INHALATION_SPRAY | CUTANEOUS | Status: DC | PRN
  Administered 2012-06-06 – 2012-06-08 (×2): 1 via TOPICAL
  Filled 2012-06-06 (×2): qty 56

## 2012-06-06 MED ORDER — ZOLPIDEM TARTRATE 5 MG PO TABS: 5.0000 mg | ORAL_TABLET | Freq: Every evening | ORAL | Status: DC | PRN

## 2012-06-06 MED ORDER — FENTANYL 2.5 MCG/ML BUPIVACAINE 1/10 % EPIDURAL INFUSION (WH - ANES)
INTRAMUSCULAR | Status: AC
  Filled 2012-06-06: qty 125

## 2012-06-06 MED ORDER — FLEET ENEMA 7-19 GM/118ML RE ENEM: 1.0000 | ENEMA | RECTAL | Status: DC | PRN

## 2012-06-06 MED ORDER — PRENATAL MULTIVITAMIN CH: 1.0000 | ORAL_TABLET | Freq: Every day | ORAL | Status: DC

## 2012-06-06 MED ORDER — ONDANSETRON HCL 4 MG/2ML IJ SOLN: 4.0000 mg | INTRAMUSCULAR | Status: DC | PRN

## 2012-06-06 MED ORDER — SIMETHICONE 80 MG PO CHEW: 80.0000 mg | CHEWABLE_TABLET | ORAL | Status: DC | PRN

## 2012-06-06 MED ORDER — FENTANYL 2.5 MCG/ML BUPIVACAINE 1/10 % EPIDURAL INFUSION (WH - ANES): 14.0000 mL/h | INTRAMUSCULAR | Status: DC | PRN

## 2012-06-06 MED ORDER — PHENYLEPHRINE 40 MCG/ML (10ML) SYRINGE FOR IV PUSH (FOR BLOOD PRESSURE SUPPORT)
80.0000 ug | PREFILLED_SYRINGE | INTRAVENOUS | Status: DC | PRN
  Filled 2012-06-06: qty 2

## 2012-06-06 MED ORDER — MEASLES, MUMPS & RUBELLA VAC ~~LOC~~ INJ
0.5000 mL | INJECTION | Freq: Once | SUBCUTANEOUS | Status: DC
  Filled 2012-06-06: qty 0.5

## 2012-06-06 MED ORDER — IBUPROFEN 600 MG PO TABS
600.0000 mg | ORAL_TABLET | Freq: Four times a day (QID) | ORAL | Status: DC
  Administered 2012-06-06 – 2012-06-08 (×8): 600 mg via ORAL
  Filled 2012-06-06 (×8): qty 1

## 2012-06-06 MED ORDER — IBUPROFEN 600 MG PO TABS: 600.0000 mg | ORAL_TABLET | Freq: Four times a day (QID) | ORAL | Status: DC | PRN

## 2012-06-06 MED ORDER — EPHEDRINE 5 MG/ML INJ
10.0000 mg | INTRAVENOUS | Status: DC | PRN
  Filled 2012-06-06: qty 2

## 2012-06-06 MED ORDER — WITCH HAZEL-GLYCERIN EX PADS: 1.0000 "application " | MEDICATED_PAD | CUTANEOUS | Status: DC | PRN

## 2012-06-06 NOTE — MAU Note (Signed)
DCallaway, RN notified Dr. Senaida Ores of pt in MAU in labor. Cervix 9cm. Dr. Senaida Ores to call Dr. Ambrose Mantle.

## 2012-06-06 NOTE — Progress Notes (Signed)
Patient ID: [name], female   DOB: [birth year], 27 y.o.   MRN: [id number] Delivery note:  The pt received an epidural and was given 20 minutes for it to become effective and then was asked tp push. She made slow but steady progress to deliver spontaneously OA over an intact perineum a living female infant with Apgars of 9 and 9 at 1 and 5 minutes. The placenta delivered intact and the uterus was normal. There were no tears. EBL 400 cc's.

## 2012-06-06 NOTE — Anesthesia Postprocedure Evaluation (Signed)
  Anesthesia Post-op Note  Patient: [name]  Procedure(s) Performed: * No procedures listed *  Patient Location: PACU and Mother/Baby  Anesthesia Type:Epidural  Level of Consciousness: awake, alert  and oriented  Airway and Oxygen Therapy: Patient Spontanous Breathing  Post-op Pain: none  Post-op Assessment: Post-op Vital signs reviewed, Patient's Cardiovascular Status Stable, No headache, No backache, No residual numbness and No residual motor weakness  Post-op Vital Signs: Reviewed and stable  Complications: No apparent anesthesia complications

## 2012-06-06 NOTE — Anesthesia Preprocedure Evaluation (Signed)
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
## 2012-06-06 NOTE — Anesthesia Procedure Notes (Signed)
Epidural Patient location during procedure: OB Start time: 06/06/2012 8:06 AM  Staffing Performed by: anesthesiologist   Preanesthetic Checklist Completed: patient identified, site marked, surgical consent, pre-op evaluation, timeout performed, IV checked, risks and benefits discussed and monitors and equipment checked  Epidural Patient position: sitting Prep: site prepped and draped and DuraPrep Patient monitoring: continuous pulse ox and blood pressure Approach: midline Injection technique: LOR air  Needle:  Needle type: Tuohy  Needle gauge: 17 G Needle length: 9 cm and 9 Needle insertion depth: 5 cm cm Catheter type: closed end flexible Catheter size: 19 Gauge Catheter at skin depth: 10 cm Test dose: negative  Assessment Events: blood not aspirated, injection not painful, no injection resistance, negative IV test and no paresthesia  Additional Notes Discussed risk of headache, infection, bleeding, nerve injury and failed or incomplete block.  Patient voices understanding and wishes to proceed.  Patient is requesting epidural at 10 cm dilation - informed that epidural given at complete dilation will likely not have time to completely relieve all pain, and that while she will have some pain relief, she will likely feel a lot of pressure for delivery.  Jasmine December, MD  Epidural placed easily on first attempt.  No paresthesia.  Patient tolerated procedure well with no apparent complications.  Jasmine December, MD

## 2012-06-07 LAB — CBC
HCT: 35.8 % — ABNORMAL LOW (ref 36.0–46.0)
MCHC: 34.6 g/dL (ref 30.0–36.0)
Platelets: 183 10*3/uL (ref 150–400)
RDW: 13.1 % (ref 11.5–15.5)
WBC: 11.2 10*3/uL — ABNORMAL HIGH (ref 4.0–10.5)

## 2012-06-07 NOTE — Progress Notes (Signed)
Patient ID: [name], female   DOB: Sep 29, 1985, 27 y.o.   MRN: [id number] #1 afebrile BP normal no problems

## 2012-06-07 NOTE — Progress Notes (Signed)
UR chart review completed.  

## 2012-06-08 MED ORDER — OXYCODONE-ACETAMINOPHEN 5-325 MG PO TABS: 1.0000 | ORAL_TABLET | Freq: Four times a day (QID) | ORAL | Status: DC | PRN

## 2012-06-08 MED ORDER — IBUPROFEN 600 MG PO TABS: 600.0000 mg | ORAL_TABLET | Freq: Four times a day (QID) | ORAL | Status: DC | PRN

## 2012-06-08 NOTE — Progress Notes (Signed)
Patient ID: [name], female   DOB: 01-Jan-1986, 27 y.o.   MRN: [id number] #2 afebrile BP normal no problems for d/c

## 2012-06-08 NOTE — Discharge Summary (Signed)
[name], [name]            ACCOUNT NO.:  [id number]  MEDICAL RECORD NO.:  [id number]  LOCATION:  9148                          [facility]ACILITY:  [facility]  PHYSICIAN:  Malachi Pro. Ambrose Mantle, M.D. DATE OF BIRTH:  Jan 09, 1986  DATE OF ADMISSION:  06/06/2012 DATE OF DISCHARGE:  06/08/2012                              DISCHARGE SUMMARY   HISTORY OF PRESENT ILLNESS:  27 year old, middle [place] female, para 1- 0-0-1, gravida 2, EDC June 16, 2012, admitted in labor at 9 cm dilatation.  Blood group and type B positive, negative antibody.  Pap smear normal.  Rubella immune.  RPR nonreactive.  Urine culture negative.  Hepatitis B surface antigen negative.  HIV negative. Hemoglobin electrophoresis AA.  GC and Chlamydia negative.  First trimester screen negative.  Cystic fibrosis negative.  One hour Glucola normal.  Group B strep is not on the chart.  The patient's prenatal course complicated by echogenic bowel initially followed by right renal pyelectasis, both of which resolved.  The patient came to the hospital in labor with the cervix 9 cm dilated.  She was in severe pain.  She wanted an epidural.  After the platelet count was received, she received an epidural and was given 20 minutes for it to become effective, and then was asked to push.  She made slow but steady progress to deliver spontaneously OA over an intact perineum by Dr. Ambrose Mantle, a living female infant 6 pounds 13 ounces with Apgars of 9 and 9 at 1 and 5 minutes. The placenta delivered intact.  Uterus was normal.  There were no tears. Blood loss about 400 mL.  Postpartum, the patient did well and was discharged on the second postpartum day.  Initial hemoglobin was 12.9, hematocrit 36.3, white count 10,300, platelet count of 187,000. Followup hemoglobin 12.4.  RPR was nonreactive.  FINAL DIAGNOSES:  Intrauterine pregnancy at 38 weeks, delivered occiput anterior.  OPERATION:  Spontaneous delivery occiput anterior.  FINAL CONDITION:   Improved.  INSTRUCTIONS:  Our regular discharge instruction booklet, and after visit summary, and prescriptions Motrin 600 mg 30 tablets one every 6 hours as needed for pain and Percocet 5/325, 30 tablets, 1 every 6 hours as needed for pain.  She was asked to return to the office in 6 weeks for followup examination.     Malachi Pro. Ambrose Mantle, M.D.     TFH/MEDQ  D:  06/08/2012  T:  06/08/2012  Job:  [id number]

## 2012-06-10 ENCOUNTER — Inpatient Hospital Stay (HOSPITAL_COMMUNITY): Admission: RE | Admit: 2012-06-10 | Payer: Medicaid Other | Source: Ambulatory Visit

## 2013-11-27 LAB — OB RESULTS CONSOLE RUBELLA ANTIBODY, IGM: Rubella: NON-IMMUNE/NOT IMMUNE

## 2013-11-27 LAB — OB RESULTS CONSOLE HEPATITIS B SURFACE ANTIGEN: HEP B S AG: NEGATIVE

## 2013-11-27 LAB — OB RESULTS CONSOLE HIV ANTIBODY (ROUTINE TESTING): HIV: NONREACTIVE

## 2013-11-27 LAB — OB RESULTS CONSOLE GC/CHLAMYDIA
Chlamydia: NEGATIVE
Gonorrhea: NEGATIVE

## 2013-11-27 LAB — OB RESULTS CONSOLE ABO/RH: RH TYPE: POSITIVE

## 2013-11-27 LAB — OB RESULTS CONSOLE ANTIBODY SCREEN: Antibody Screen: NEGATIVE

## 2013-11-27 LAB — OB RESULTS CONSOLE RPR: RPR: NONREACTIVE

## 2014-01-15 ENCOUNTER — Encounter (HOSPITAL_COMMUNITY): Payer: Self-pay

## 2014-04-15 IMAGING — US US OB FOLLOW-UP
1 series · 12 of 28 positions shown · non-contrast
Comparison: none

[Series 1: us ob follow-up · 0.21mm/px · 12 of 41 slices shown]
[im 2/41]
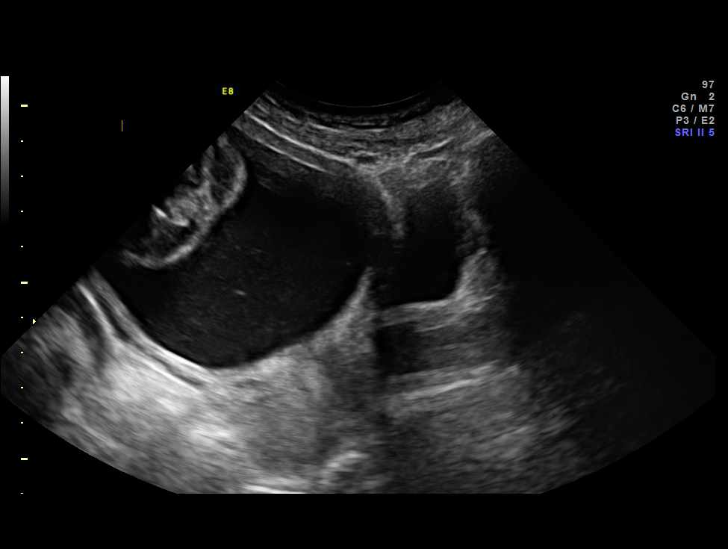
[im 5/41]
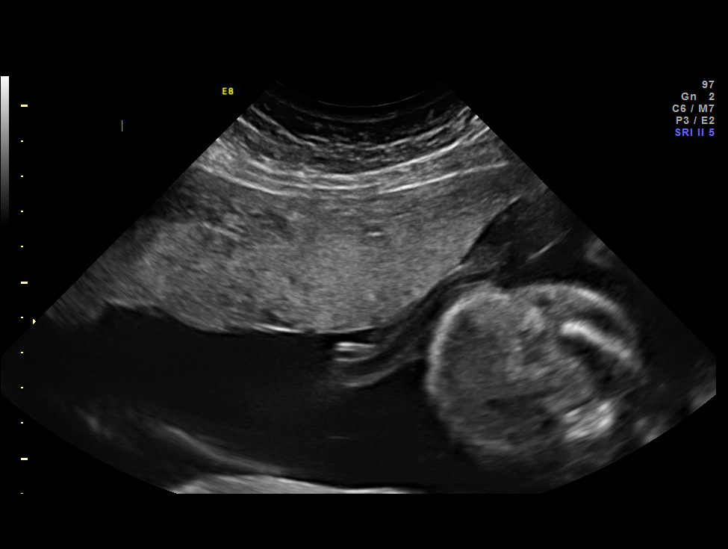
[im 8/41]
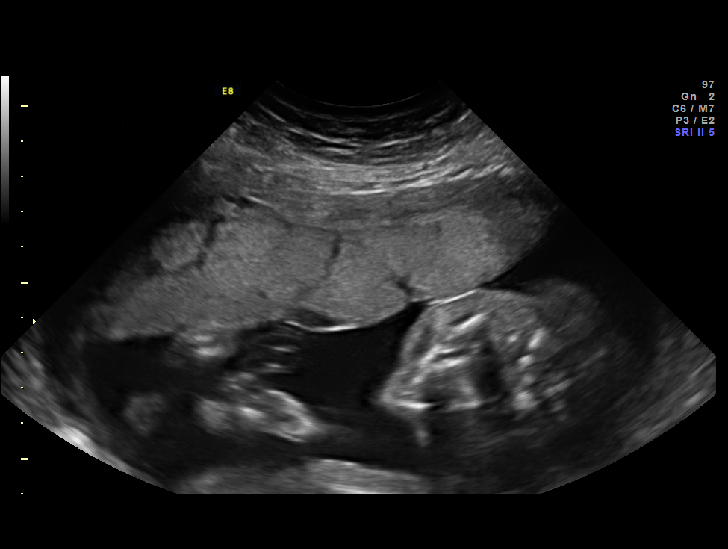
[im 12/41]
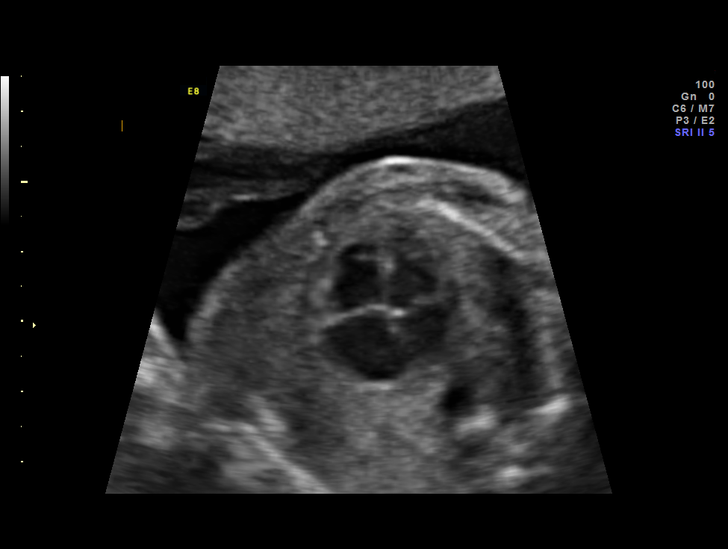
[im 15/41]
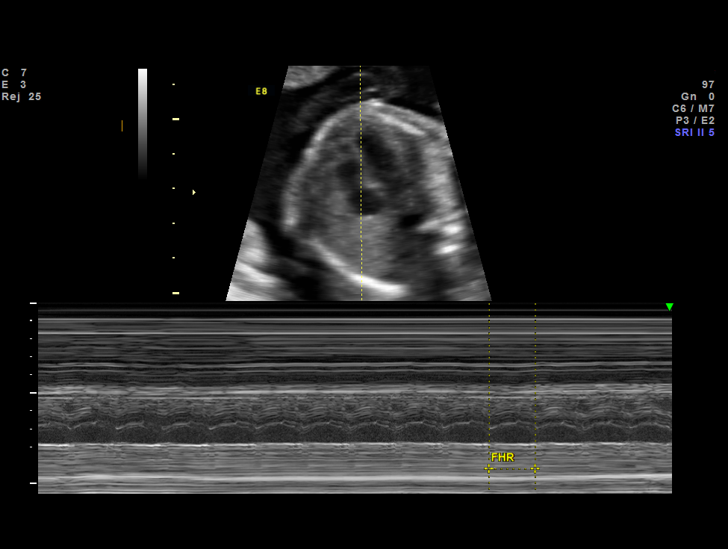
[im 18/41]
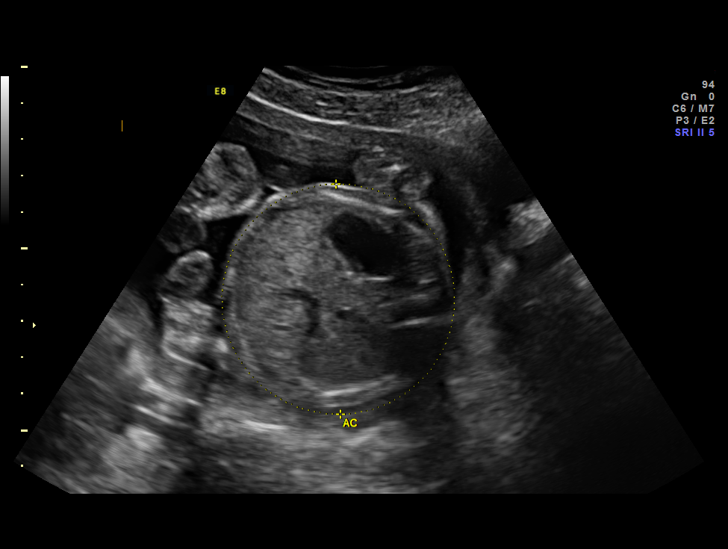
[im 23/41]
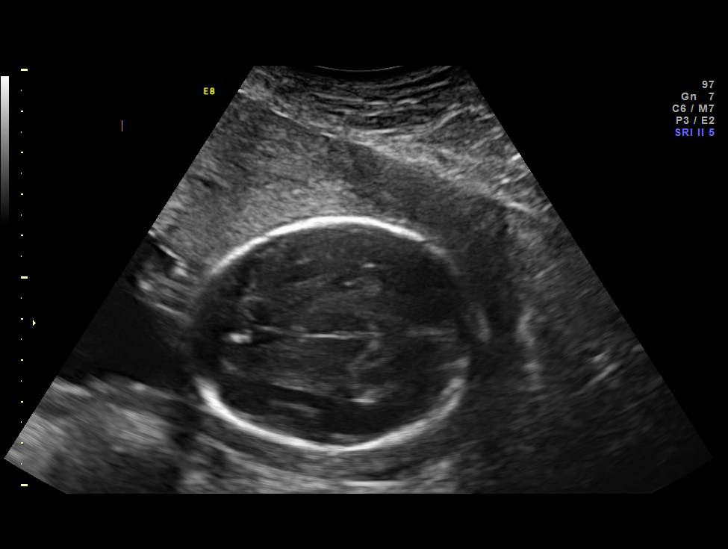
[im 26/41]
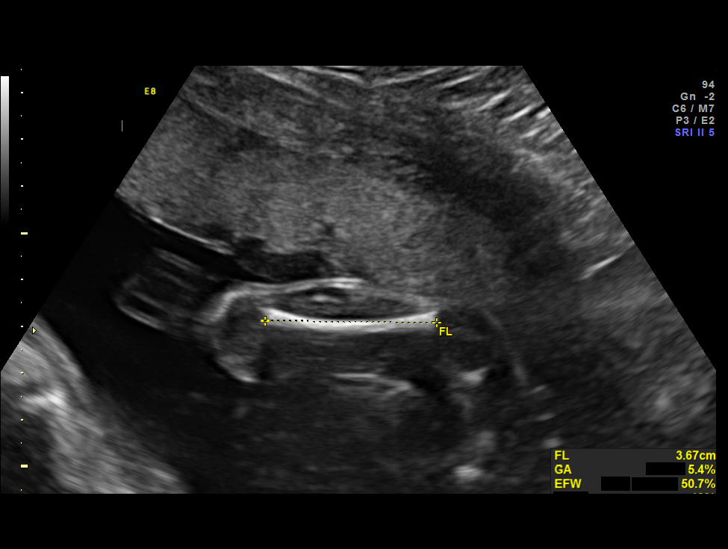
[im 29/41]
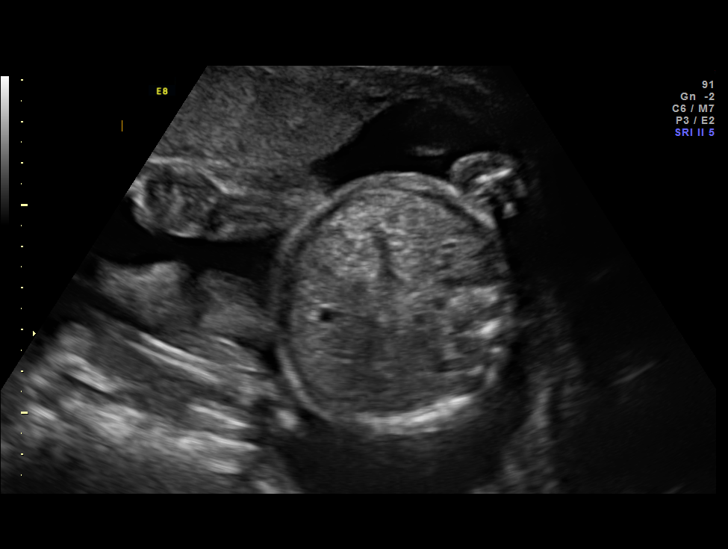
[im 33/41]
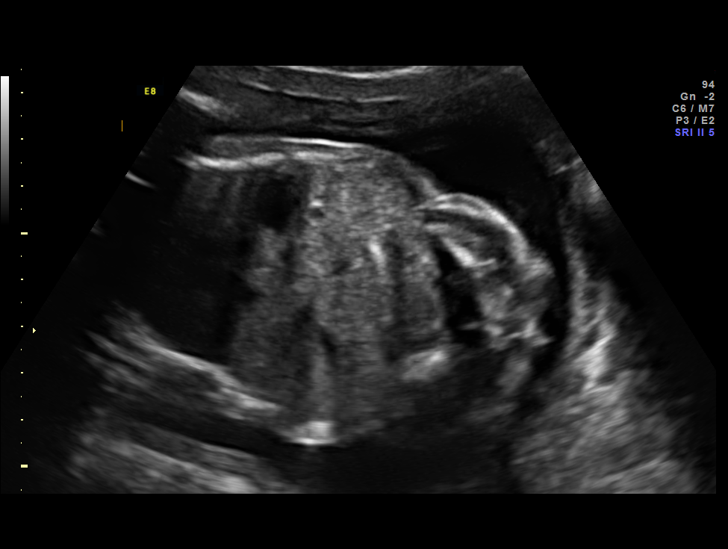
[im 36/41]
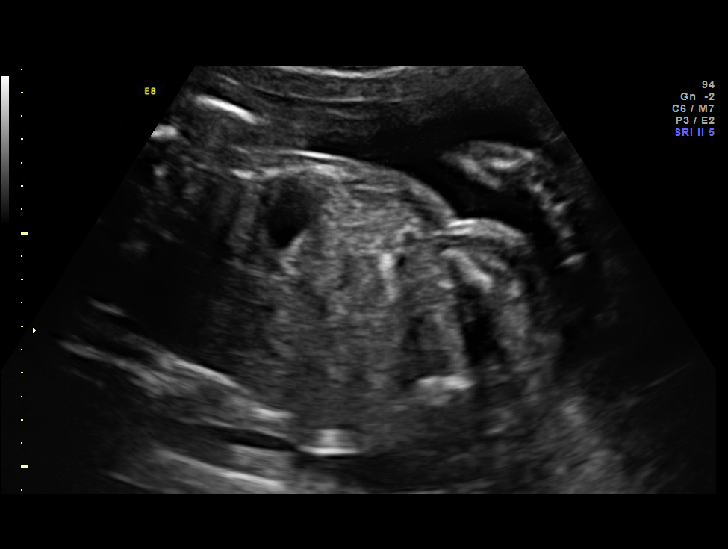
[im 39/41]
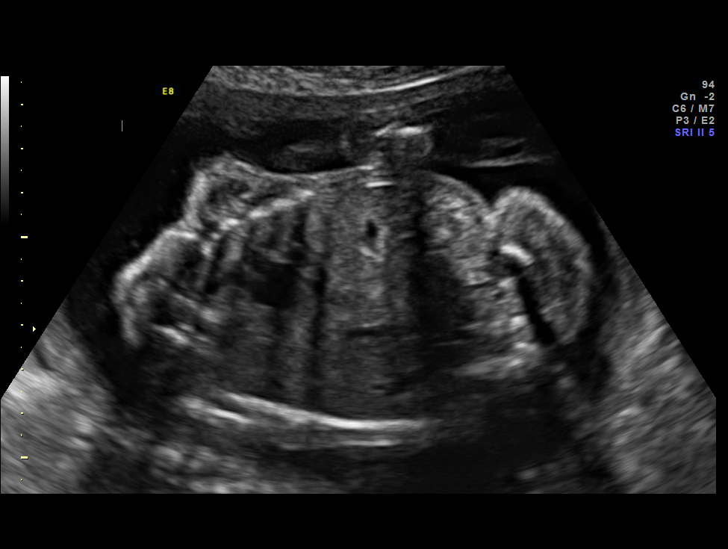

[12 of 28 positions shown; findings below may reference images not displayed]

OBSTETRICS REPORT
                      (Signed Final 02/19/2012 [DATE])

Service(s) Provided

 US OB FOLLOW UP                                       76816.1
Indications

 Fetal abnormality - other known or suspected
Fetal Evaluation

 Num Of Fetuses:    1
 Fetal Heart Rate:  145                          bpm
 Cardiac Activity:  Observed
 Presentation:      Frank breech
 Placenta:          Anterior, above cervical os

 Amniotic Fluid
 AFI FV:      Subjectively within normal limits
                                             Larg Pckt:     4.4  cm
Biometry

 BPD:     54.8  mm     G. Age:  22w 5d                CI:         73.8   70 - 86
 OFD:     74.3  mm                                    FL/HC:      18.8   19.2 -

 HC:     206.5  mm     G. Age:  22w 5d       22  %    HC/AC:      1.04   1.05 -

 AC:     197.9  mm     G. Age:  24w 3d       80  %    FL/BPD:     70.8   71 - 87
 FL:      38.8  mm     G. Age:  22w 3d       19  %    FL/AC:      19.6   20 - 24

 Est. FW:     597  gm      1 lb 5 oz     58  %
Gestational Age

 U/S Today:     23w 0d                                        EDD:   06/17/12
 Best:          23w 1d     Det. By:  Early Ultrasound         EDD:   06/16/12
                                     (12/01/11)
Anatomy

 Cranium:          Appears normal         Aortic Arch:      Previously seen
 Fetal Cavum:      Appears normal         Ductal Arch:      Previously seen
 Ventricles:       Appears normal         Diaphragm:        Previously seen
 Choroid Plexus:   Appears normal         Stomach:          Appears normal, left
                                                            sided
 Cerebellum:       Previously seen        Abdomen:          Echogenic Bowel
 Posterior Fossa:  Previously seen        Abdominal Wall:   Previously seen
 Nuchal Fold:      Previously seen        Cord Vessels:     Previously seen
 Face:             Orbits appear          Kidneys:          Appear normal
                   normal
 Lips:             Previously seen        Bladder:          Appears normal
 Heart:            Appears normal         Spine:            Previously seen
                   (4CH, axis, and
                   situs)
 RVOT:             Appears normal         Lower             Previously seen
                                          Extremities:
 LVOT:             Appears normal         Upper             Previously seen
                                          Extremities:

 Other:  Fetus appears to be a male. Heels and 5th digit appear previously
         seen.
Cervix Uterus Adnexa

 Cervical Length:    3.1      cm

 Cervix:       Normal appearance by transabdominal scan.
 Left Ovary:    Not visualized.
 Right Ovary:   Not visualized.

 Adnexa:     No abnormality visualized.
Comments

 Ms. Keyli is being followed due to echogenic bowel.
 TORCH titers have been completed and are negative for
 acute infection.  There are no stigmata of congenital infection
 seen on today's ultrasound.
Impression

 Single living intrauterine pregnancy at 23 weeks 1 day.
 Appropriate interval fetal growth (58%).
 Normal amniotic fluid volume.
 Normal interval fetal anatomy.
 No stigmata of congenital infection seen.
Recommendations

 Recommend follow-up ultrasound examination in 5 weeks.

                Amazigh, Quirijn

## 2014-05-24 LAB — OB RESULTS CONSOLE GBS: GBS: NEGATIVE

## 2014-06-08 ENCOUNTER — Telehealth (HOSPITAL_COMMUNITY): Payer: Self-pay | Admitting: *Deleted

## 2014-06-08 ENCOUNTER — Encounter (HOSPITAL_COMMUNITY): Payer: Self-pay | Admitting: *Deleted

## 2014-06-08 NOTE — Telephone Encounter (Signed)
Preadmission screen  

## 2014-06-15 ENCOUNTER — Inpatient Hospital Stay (HOSPITAL_COMMUNITY)
Admission: RE | Admit: 2014-06-15 | Discharge: 2014-06-17 | DRG: 775 | Disposition: A | Discharge status: Home / Self Care | Payer: Medicaid Other | Source: Ambulatory Visit | Attending: Obstetrics and Gynecology | Admitting: Obstetrics and Gynecology

## 2014-06-15 ENCOUNTER — Encounter (HOSPITAL_COMMUNITY): Payer: Self-pay | Admitting: Anesthesiology

## 2014-06-15 ENCOUNTER — Inpatient Hospital Stay (HOSPITAL_COMMUNITY): Payer: Medicaid Other | Admitting: Anesthesiology

## 2014-06-15 ENCOUNTER — Encounter (HOSPITAL_COMMUNITY): Payer: Self-pay

## 2014-06-15 DIAGNOSIS — O358XX0 Maternal care for other (suspected) fetal abnormality and damage, not applicable or unspecified: Principal | ICD-10-CM | POA: Diagnosis present

## 2014-06-15 DIAGNOSIS — Z3A39 39 weeks gestation of pregnancy: Secondary | ICD-10-CM | POA: Diagnosis present

## 2014-06-15 DIAGNOSIS — O9989 Other specified diseases and conditions complicating pregnancy, childbirth and the puerperium: Secondary | ICD-10-CM | POA: Diagnosis present

## 2014-06-15 LAB — TYPE AND SCREEN
ABO/RH(D): B POS
Antibody Screen: NEGATIVE

## 2014-06-15 LAB — CBC
HCT: 37.9 % (ref 36.0–46.0)
Hemoglobin: 13.5 g/dL (ref 12.0–15.0)
MCH: 32.4 pg (ref 26.0–34.0)
MCHC: 35.6 g/dL (ref 30.0–36.0)
MCV: 90.9 fL (ref 78.0–100.0)
Platelets: 186 10*3/uL (ref 150–400)
RBC: 4.17 MIL/uL (ref 3.87–5.11)
RDW: 12.7 % (ref 11.5–15.5)
WBC: 7.7 10*3/uL (ref 4.0–10.5)

## 2014-06-15 LAB — ABO/RH: ABO/RH(D): B POS

## 2014-06-15 MED ORDER — ONDANSETRON HCL 4 MG/2ML IJ SOLN: 4.0000 mg | INTRAMUSCULAR | Status: DC | PRN

## 2014-06-15 MED ORDER — LANOLIN HYDROUS EX OINT: TOPICAL_OINTMENT | CUTANEOUS | Status: DC | PRN

## 2014-06-15 MED ORDER — OXYCODONE-ACETAMINOPHEN 5-325 MG PO TABS: 1.0000 | ORAL_TABLET | ORAL | Status: DC | PRN

## 2014-06-15 MED ORDER — ACETAMINOPHEN 325 MG PO TABS
650.0000 mg | ORAL_TABLET | ORAL | Status: DC | PRN
Start: 2014-06-15 — End: 2014-06-17

## 2014-06-15 MED ORDER — DIBUCAINE 1 % RE OINT: 1.0000 "application " | TOPICAL_OINTMENT | RECTAL | Status: DC | PRN

## 2014-06-15 MED ORDER — FENTANYL 2.5 MCG/ML BUPIVACAINE 1/10 % EPIDURAL INFUSION (WH - ANES)
14.0000 mL/h | INTRAMUSCULAR | Status: DC | PRN
  Administered 2014-06-15: 14 mL/h via EPIDURAL

## 2014-06-15 MED ORDER — OXYCODONE-ACETAMINOPHEN 5-325 MG PO TABS
1.0000 | ORAL_TABLET | ORAL | Status: DC | PRN
  Administered 2014-06-15 – 2014-06-17 (×2): 1 via ORAL
  Filled 2014-06-15 (×2): qty 1

## 2014-06-15 MED ORDER — ONDANSETRON HCL 4 MG/2ML IJ SOLN: 4.0000 mg | Freq: Four times a day (QID) | INTRAMUSCULAR | Status: DC | PRN

## 2014-06-15 MED ORDER — SENNOSIDES-DOCUSATE SODIUM 8.6-50 MG PO TABS
2.0000 | ORAL_TABLET | ORAL | Status: DC
  Administered 2014-06-16: 2 via ORAL
  Filled 2014-06-15: qty 2

## 2014-06-15 MED ORDER — BENZOCAINE-MENTHOL 20-0.5 % EX AERO
1.0000 "application " | INHALATION_SPRAY | CUTANEOUS | Status: DC | PRN
  Administered 2014-06-15: 1 via TOPICAL
  Filled 2014-06-15: qty 56

## 2014-06-15 MED ORDER — WITCH HAZEL-GLYCERIN EX PADS: 1.0000 "application " | MEDICATED_PAD | CUTANEOUS | Status: DC | PRN

## 2014-06-15 MED ORDER — LACTATED RINGERS IV SOLN
500.0000 mL | INTRAVENOUS | Status: DC | PRN
  Administered 2014-06-15: 500 mL via INTRAVENOUS

## 2014-06-15 MED ORDER — LIDOCAINE HCL (PF) 1 % IJ SOLN
INTRAMUSCULAR | Status: DC | PRN
  Administered 2014-06-15 (×2): 5 mL

## 2014-06-15 MED ORDER — EPHEDRINE 5 MG/ML INJ
10.0000 mg | INTRAVENOUS | Status: DC | PRN
  Filled 2014-06-15: qty 2
  Filled 2014-06-15: qty 4

## 2014-06-15 MED ORDER — ZOLPIDEM TARTRATE 5 MG PO TABS: 5.0000 mg | ORAL_TABLET | Freq: Every evening | ORAL | Status: DC | PRN

## 2014-06-15 MED ORDER — CITRIC ACID-SODIUM CITRATE 334-500 MG/5ML PO SOLN: 30.0000 mL | ORAL | Status: DC | PRN

## 2014-06-15 MED ORDER — OXYTOCIN 40 UNITS IN LACTATED RINGERS INFUSION - SIMPLE MED: 62.5000 mL/h | INTRAVENOUS | Status: AC | PRN

## 2014-06-15 MED ORDER — OXYTOCIN 40 UNITS IN LACTATED RINGERS INFUSION - SIMPLE MED: 62.5000 mL/h | INTRAVENOUS | Status: DC

## 2014-06-15 MED ORDER — OXYTOCIN 40 UNITS IN LACTATED RINGERS INFUSION - SIMPLE MED
1.0000 m[IU]/min | INTRAVENOUS | Status: DC
  Administered 2014-06-15: 1 m[IU]/min via INTRAVENOUS
  Filled 2014-06-15: qty 1000

## 2014-06-15 MED ORDER — FENTANYL 2.5 MCG/ML BUPIVACAINE 1/10 % EPIDURAL INFUSION (WH - ANES): 14.0000 mL/h | INTRAMUSCULAR | Status: DC | PRN

## 2014-06-15 MED ORDER — DIPHENHYDRAMINE HCL 50 MG/ML IJ SOLN: 12.5000 mg | INTRAMUSCULAR | Status: DC | PRN

## 2014-06-15 MED ORDER — DIPHENHYDRAMINE HCL 25 MG PO CAPS: 25.0000 mg | ORAL_CAPSULE | Freq: Four times a day (QID) | ORAL | Status: DC | PRN

## 2014-06-15 MED ORDER — OXYCODONE-ACETAMINOPHEN 5-325 MG PO TABS
2.0000 | ORAL_TABLET | ORAL | Status: DC | PRN
  Administered 2014-06-16 (×2): 2 via ORAL
  Filled 2014-06-15 (×2): qty 2

## 2014-06-15 MED ORDER — LACTATED RINGERS IV SOLN
INTRAVENOUS | Status: AC
  Administered 2014-06-15: 15:00:00 via INTRAVENOUS

## 2014-06-15 MED ORDER — LACTATED RINGERS IV SOLN
500.0000 mL | Freq: Once | INTRAVENOUS | Status: AC
  Administered 2014-06-15: 500 mL via INTRAVENOUS

## 2014-06-15 MED ORDER — ACETAMINOPHEN 325 MG PO TABS: 650.0000 mg | ORAL_TABLET | ORAL | Status: DC | PRN

## 2014-06-15 MED ORDER — FENTANYL 2.5 MCG/ML BUPIVACAINE 1/10 % EPIDURAL INFUSION (WH - ANES)
INTRAMUSCULAR | Status: DC | PRN
  Administered 2014-06-15: 14 mL/h via EPIDURAL

## 2014-06-15 MED ORDER — LIDOCAINE HCL (PF) 1 % IJ SOLN
30.0000 mL | INTRAMUSCULAR | Status: DC | PRN
  Filled 2014-06-15: qty 30

## 2014-06-15 MED ORDER — PHENYLEPHRINE 40 MCG/ML (10ML) SYRINGE FOR IV PUSH (FOR BLOOD PRESSURE SUPPORT)
80.0000 ug | PREFILLED_SYRINGE | INTRAVENOUS | Status: DC | PRN
  Administered 2014-06-15: 80 ug via INTRAVENOUS
  Filled 2014-06-15: qty 2

## 2014-06-15 MED ORDER — EPHEDRINE 5 MG/ML INJ
10.0000 mg | INTRAVENOUS | Status: DC | PRN
  Administered 2014-06-15: 10 mg via INTRAVENOUS
  Filled 2014-06-15: qty 2

## 2014-06-15 MED ORDER — OXYTOCIN BOLUS FROM INFUSION
500.0000 mL | INTRAVENOUS | Status: DC
  Administered 2014-06-15: 500 mL via INTRAVENOUS

## 2014-06-15 MED ORDER — FLEET ENEMA 7-19 GM/118ML RE ENEM: 1.0000 | ENEMA | Freq: Every day | RECTAL | Status: DC | PRN

## 2014-06-15 MED ORDER — LACTATED RINGERS IV SOLN
INTRAVENOUS | Status: DC
  Administered 2014-06-15: 08:00:00 via INTRAVENOUS

## 2014-06-15 MED ORDER — PRENATAL MULTIVITAMIN CH: 1.0000 | ORAL_TABLET | Freq: Every day | ORAL | Status: DC

## 2014-06-15 MED ORDER — TETANUS-DIPHTH-ACELL PERTUSSIS 5-2.5-18.5 LF-MCG/0.5 IM SUSP: 0.5000 mL | Freq: Once | INTRAMUSCULAR | Status: DC

## 2014-06-15 MED ORDER — PRENATAL MULTIVITAMIN CH
1.0000 | ORAL_TABLET | Freq: Every day | ORAL | Status: DC
  Administered 2014-06-16: 1 via ORAL
  Filled 2014-06-15: qty 1

## 2014-06-15 MED ORDER — IBUPROFEN 600 MG PO TABS
600.0000 mg | ORAL_TABLET | Freq: Four times a day (QID) | ORAL | Status: DC
  Administered 2014-06-15 – 2014-06-17 (×7): 600 mg via ORAL
  Filled 2014-06-15 (×7): qty 1

## 2014-06-15 MED ORDER — PHENYLEPHRINE 40 MCG/ML (10ML) SYRINGE FOR IV PUSH (FOR BLOOD PRESSURE SUPPORT)
80.0000 ug | PREFILLED_SYRINGE | INTRAVENOUS | Status: DC | PRN
  Filled 2014-06-15: qty 2
  Filled 2014-06-15: qty 20

## 2014-06-15 MED ORDER — MEASLES, MUMPS & RUBELLA VAC ~~LOC~~ INJ
0.5000 mL | INJECTION | Freq: Once | SUBCUTANEOUS | Status: AC
  Administered 2014-06-17: 0.5 mL via SUBCUTANEOUS
  Filled 2014-06-15: qty 0.5

## 2014-06-15 MED ORDER — PHENYLEPHRINE 40 MCG/ML (10ML) SYRINGE FOR IV PUSH (FOR BLOOD PRESSURE SUPPORT)
PREFILLED_SYRINGE | INTRAVENOUS | Status: AC
  Administered 2014-06-15: 80 ug via INTRAVENOUS
  Filled 2014-06-15: qty 20

## 2014-06-15 MED ORDER — SIMETHICONE 80 MG PO CHEW: 80.0000 mg | CHEWABLE_TABLET | ORAL | Status: DC | PRN

## 2014-06-15 MED ORDER — TERBUTALINE SULFATE 1 MG/ML IJ SOLN
0.2500 mg | Freq: Once | INTRAMUSCULAR | Status: DC | PRN
  Filled 2014-06-15: qty 1

## 2014-06-15 MED ORDER — FENTANYL 2.5 MCG/ML BUPIVACAINE 1/10 % EPIDURAL INFUSION (WH - ANES)
INTRAMUSCULAR | Status: AC
  Administered 2014-06-15: 14 mL/h via EPIDURAL
  Filled 2014-06-15: qty 125

## 2014-06-15 MED ORDER — OXYCODONE-ACETAMINOPHEN 5-325 MG PO TABS: 2.0000 | ORAL_TABLET | ORAL | Status: DC | PRN

## 2014-06-15 MED ORDER — ONDANSETRON HCL 4 MG PO TABS: 4.0000 mg | ORAL_TABLET | ORAL | Status: DC | PRN

## 2014-06-15 NOTE — Progress Notes (Signed)
UR chart review completed.  

## 2014-06-15 NOTE — Progress Notes (Signed)
Patient ID: [name], female   DOB: 08-Apr-1985, 29 y.o.   MRN: [id number] Pt was fully dilated at 12:30 PM After a short time she was asked to push.

## 2014-06-15 NOTE — Anesthesia Preprocedure Evaluation (Signed)

## 2014-06-15 NOTE — H&P (Signed)
NAMCherylann Alvarado:  Alvarado, [name]            ACCOUNT NO.:  [id number]  MEDICAL RECORD NO.:  [id number]  LOCATION:                                 FACILITY:  PHYSICIAN:  Malachi Prohomas F. Ambrose MantleHenley, M.D.      DATE OF BIRTH:  DATE OF ADMISSION:  06/15/2014 DATE OF DISCHARGE:                             HISTORY & PHYSICAL   PRESENT ILLNESS:  This is a 29 year old middle [place] female, para 2-0- 0-2, gravida 3, who is admitted for induction of labor.  Blood group and type B positive, negative antibody, RPR negative.  Urine culture negative.  Hepatitis B surface antigen negative, HIV negative, GC and Chlamydia negative.  Varicella immune.  Rubella not immune.  First trimester screen negative.  AFP negative.  One-hour Glucola 59.  Repeat HIV and RPR negative.  Group B strep negative.  This patient's prenatal course was complicated only by ultrasound showing slightly enlarged pelvis of the baby's kidneys.  They seemed to be stable at 5 mm on her last ultrasound on May 15, 2014, the renal pelves were 6 mm on the right and 7 mm on the left.  The gallbladder was noted to be elongated of uncertain significance.  At this time, the patient is admitted for induction of labor.  The cervix is favorable.  PAST MEDICAL HISTORY:  Unremarkable.  PAST SURGICAL HISTORY:  None.  ALLERGIES:  No known drug allergies.  No latex allergy.  SOCIAL HISTORY:  The patient never smoked.  Does not drink.  Does not take illegal drugs.  She has post graduate degree in [employer] in 2010.  FAMILY HISTORY:  No significant history that she knows of.  The patient has had 2 vaginal deliveries.  PHYSICAL EXAMINATION:  VITAL SIGNS:  On admission, blood pressure 108/70, pulse is 80, weight 166 pounds. HEART:  Normal size and sounds.  No murmurs. LUNGS:  Clear to auscultation. GU:  Fundal height 37.5 cm cervix 2 cm, 40%, vertex at a -3.  ADMITTING IMPRESSION:  Intrauterine pregnancy at 39 weeks, favorable cervix.  The patient is  admitted for induction of labor.  I will inform the pediatric team that the patient's baby has mild bilateral renal pelvis enlargement and the gallbladder appears elongated.    Malachi Prohomas F. Ambrose MantleHenley, M.D.    TFH/MEDQ  D:  06/14/2014  T:  06/14/2014  Job:  [id number]

## 2014-06-15 NOTE — Progress Notes (Signed)
Patient ID: [name], female   DOB: 05/06/1985, 29 y.o.   MRN: [id number] Pt admitted at 39 weeks for induction of labor VS normal

## 2014-06-15 NOTE — Anesthesia Postprocedure Evaluation (Signed)
  Anesthesia Post-op Note  Patient: [name]  Procedure(s) Performed: * No procedures listed *  Patient Location: PACU and Mother/Baby  Anesthesia Type:Epidural  Level of Consciousness: awake  Airway and Oxygen Therapy: Patient Spontanous Breathing  Post-op Pain: none  Post-op Assessment: Post-op Vital signs reviewed  Post-op Vital Signs: Reviewed  Last Vitals:  Filed Vitals:   06/15/14 1433  BP:   Pulse:   Temp:   Resp: 18    Complications: No apparent anesthesia complications

## 2014-06-15 NOTE — Progress Notes (Signed)
Patient ID: [name], female   DOB: 1985/11/28, 29 y.o.   MRN: [id number] Delivery note:  The pushed well and brought the vertex down. She delivered a living female infant spontaneously LOA over an intact perineum. The Apgars were 9 and 9 at 1 and 5 minutes The placenta was intact and the uterus was normal. There were no lacerations. EBL 400 cc's.

## 2014-06-15 NOTE — Lactation Note (Signed)
This note was copied from the chart of Kelli Cherylann RatelShadieh Eichholz. Lactation Consultation Note Initial visit at 7 hours.  Mom reports several good feedings and denies pain.  Baby is asleep in crib.  Mom has experience with breastfeeding 2 older children for 14-15 months each with no problems.  [facility] resources given and discussed.  Encouraged to feed with early cues on demand.  Early newborn behavior discussed.  Hand expression demonstrated with colostrum visible encouraged mom to try before feedings.  Mom to call for assist as needed.     Patient Name: [name] [id number]'s Date: 06/15/2014 Reason for consult: Initial assessment   Maternal Data Has patient been taught Hand Expression?: Yes Does the patient have breastfeeding experience prior to this delivery?: Yes  Feeding Feeding Type: Breast Fed Length of feed: 10 min  LATCH Score/Interventions                      Lactation Tools Discussed/Used WIC Program: Yes   Consult Status Consult Status: Follow-up Date: 06/16/14 Follow-up type: In-patient    Jannifer RodneyShoptaw, Jana Lynn 06/15/2014, 8:06 PM

## 2014-06-15 NOTE — Anesthesia Procedure Notes (Addendum)
Epidural   Epidural Patient location during procedure: OB  Staffing Anesthesiologist: Sebastian AcheMANNY, [name]  Preanesthetic Checklist Completed: patient identified, site marked, surgical consent, pre-op evaluation, timeout performed, IV checked, risks and benefits discussed and monitors and equipment checked  Epidural Patient position: sitting Prep: site prepped and draped and DuraPrep Patient monitoring: continuous pulse ox and blood pressure Approach: midline Location: L3-L4 Injection technique: LOR air  Needle:  Needle type: Tuohy  Needle gauge: 17 G Needle length: 9 cm and 9 Needle insertion depth: 5 cm cm Catheter type: closed end flexible Catheter size: 19 Gauge Catheter at skin depth: 10 cm Test dose: negative  Assessment Events: blood not aspirated, injection not painful, no injection resistance, negative IV test and no paresthesia

## 2014-06-15 NOTE — Progress Notes (Signed)
Assessed, epidural level on patient as instructed by Dr. Jean RosenthalJackson with ice. Patient could feel ice on chest above breast and then could not feel it on the right breast. MD notified, no orders or further instructions given.

## 2014-06-16 LAB — CBC
HCT: 36.4 % (ref 36.0–46.0)
HEMOGLOBIN: 12.6 g/dL (ref 12.0–15.0)
MCH: 31.9 pg (ref 26.0–34.0)
MCHC: 34.6 g/dL (ref 30.0–36.0)
MCV: 92.2 fL (ref 78.0–100.0)
Platelets: 186 10*3/uL (ref 150–400)
RBC: 3.95 MIL/uL (ref 3.87–5.11)
RDW: 12.9 % (ref 11.5–15.5)
WBC: 11.8 10*3/uL — ABNORMAL HIGH (ref 4.0–10.5)

## 2014-06-16 LAB — RPR: RPR Ser Ql: NONREACTIVE

## 2014-06-16 NOTE — Anesthesia Postprocedure Evaluation (Signed)
Anesthesia Post Note  Patient: [name]  Procedure(s) Performed: * No procedures listed *  Anesthesia type: Epidural  Patient location: Mother/Baby  Post pain: Pain level controlled  Post assessment: Post-op Vital signs reviewed  Last Vitals:  Filed Vitals:   06/16/14 0520  BP: 90/63  Pulse: 73  Temp: 36.5 C  Resp: 18    Post vital signs: Reviewed  Level of consciousness:alert  Complications: No apparent anesthesia complications

## 2014-06-16 NOTE — Progress Notes (Signed)
Patient ID: [name], female   DOB: 11-07-1985, 29 y.o.   MRN: [id number] #1 afebrile BP normal HGB stable Pt states abdominal US of baby was normal.

## 2014-06-17 MED ORDER — OXYCODONE-ACETAMINOPHEN 5-325 MG PO TABS: 1.0000 | ORAL_TABLET | Freq: Four times a day (QID) | ORAL | Status: DC | PRN

## 2014-06-17 MED ORDER — IBUPROFEN 600 MG PO TABS: 600.0000 mg | ORAL_TABLET | Freq: Four times a day (QID) | ORAL | Status: DC | PRN

## 2014-06-17 NOTE — Progress Notes (Signed)
Patient ID: [name], female   DOB: 1985/09/16, 29 y.o.   MRN: [id number] #2 afebrile BP normal for d/c.

## 2014-06-17 NOTE — Lactation Note (Signed)
This note was copied from the chart of Kelli Cherylann RatelShadieh Stempel. Lactation Consultation Note  Patient Name: [name] [id number]'s Date: 06/17/2014 Reason for consult: Follow-up assessment  Baby is 4544 hours old and has been consistent at the breast 10- 15 mins, feeding every 1 -2 hours   , 6 voids, 4 stools, 35 hrs. Bili check 7.4, Per mom breast are getting fuller and nipples are aliitle tender , LC assessed  With moms permission and noted no breakdown , and just pink, LC instructed on the use of comfort gels. Sore nipple and engorgement prevention and tx reviewed , referring to the Baby and me booklet. LC instructed mom on the use hand pump. Mother informed of post-discharge support and given phone number to the lactation department, including services for phone call  assistance; out-patient appointments; and breastfeeding support group. List of other breastfeeding resources in the community given in  the handout. Encouraged mother to call for problems or concerns related to breastfeeding.    Maternal Data    Feeding Feeding Type: Breast Fed Length of feed: 15 min  LATCH Score/Interventions                      Lactation Tools Discussed/Used Tools: Pump Breast pump type: Manual Pump Review: Setup, frequency, and cleaning;Milk Storage Initiated by:: MAI  Date initiated:: 06/17/14   Consult Status Consult Status: Complete Date: 06/17/14    Kathrin Greathouseorio, [name] 06/17/2014, 9:27 AM

## 2014-06-17 NOTE — Discharge Instructions (Signed)
booklet °

## 2014-06-17 NOTE — Discharge Summary (Signed)
NAMCherylann Alvarado:  Demond, [name]            ACCOUNT NO.:  [id number]  MEDICAL RECORD NO.:  [id number]  LOCATION:  9124                          FACILITY:  [facility]  PHYSICIAN:  Malachi Prohomas F. Ambrose MantleHenley, M.D. DATE OF BIRTH:  08-05-1985  DATE OF ADMISSION:  06/15/2014 DATE OF DISCHARGE:  06/17/2014                              DISCHARGE SUMMARY   A 29 year old, middle [place] female, para 2-0-0-2, gravida 3, admitted for induction of labor.  Prenatal labs were normal except she was not immune to rubella.  Prenatal course was complicated only by the baby's renal pelves, both of which was slightly above the normal limit.  The patient was admitted for induction.  She started having painful contractions, received an epidural and reached full dilatation at 12:30 p.m.  She pushed well, brought the vertex down and delivered a living female infant spontaneously, LOA over an intact perineum.  The Apgars were 9 and 9 at one and five minutes.  Placenta was intact.  Uterus normal. No lacerations.  Blood loss 400 mL.  Postpartum, the patient did quite well.  The baby's abdominal ultrasound showed normal kidneys bilaterally.  On the second postpartum day, the patient was ready for discharge, doing well.  LABORATORY DATA:  Initial labs, hemoglobin 13.5, hematocrit 37.9, white count 7700.  Followup hemoglobin 12.6.  FINAL DIAGNOSES:  Intrauterine pregnancy, 39+ weeks, delivered vertex.  OPERATION:  Spontaneous delivery, vertex.  FINAL CONDITION:  Improved.  DISCHARGE MEDICATIONS:  Continue prenatal vitamins, Percocet 5/325, 30 tablets, 1 every 6 hours as needed for pain, Motrin 600 mg 30 tablets, 1 every 6 hours as needed for pain.  The patient is advised to return to the office in 6 weeks for followup examination.  Her pediatrician also does circumcision, so I have advised her to do the circumcision in the office where the expense is the least.  If I do it, I want to do it within 2 weeks.  Patient has been  informed.     Malachi Prohomas F. Ambrose MantleHenley, M.D.     TFH/MEDQ  D:  06/17/2014  T:  06/17/2014  Job:  [id number]

## 2014-07-08 IMAGING — US US OB FOLLOW-UP
1 series · 12 of 28 positions shown · non-contrast
Comparison: none

[Series 1: us ob follow-up · 12 of 38 slices shown]
[im 2/38]
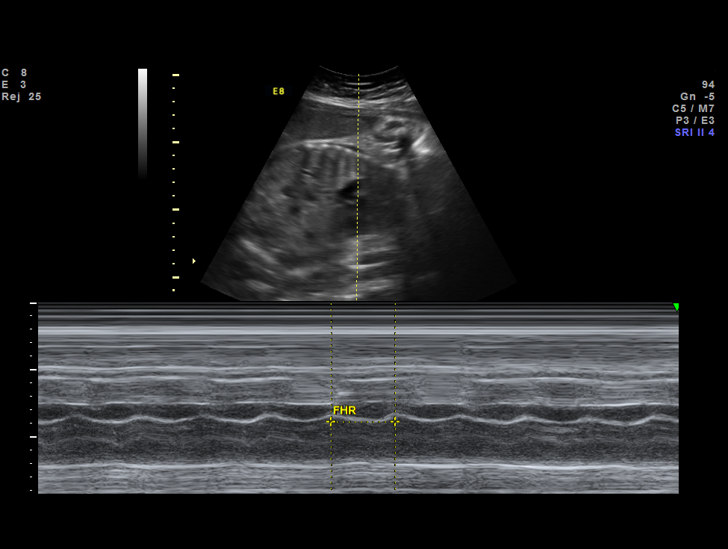
[im 5/38]
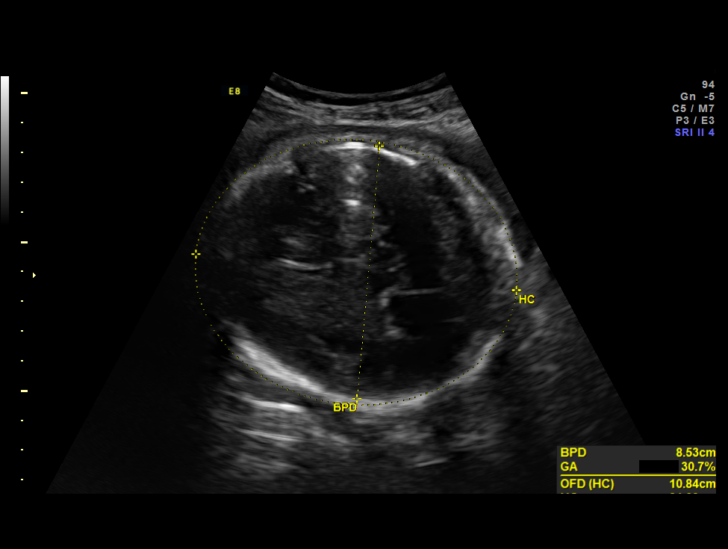
[im 7/38]
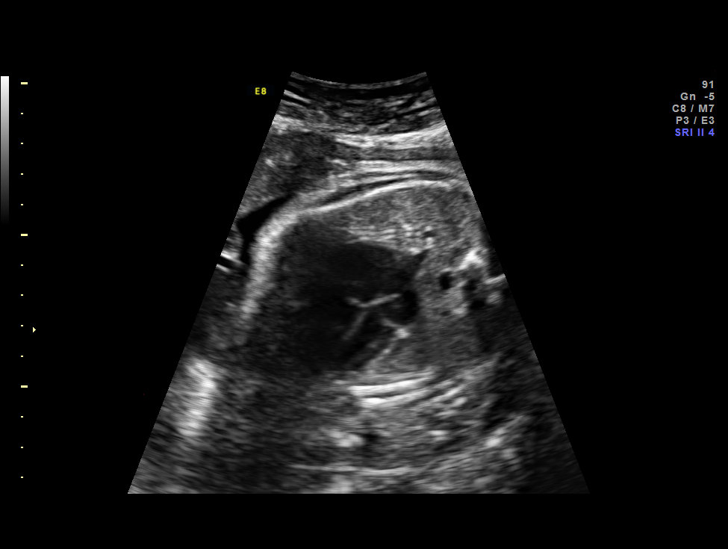
[im 11/38]
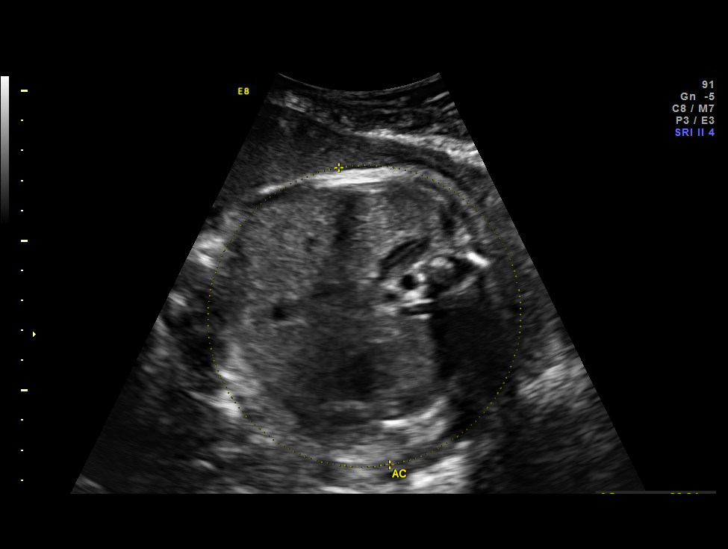
[im 14/38]
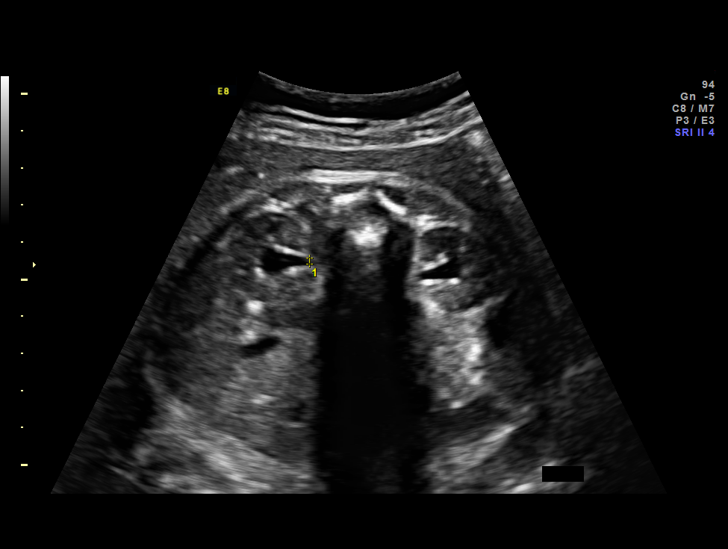
[im 17/38]
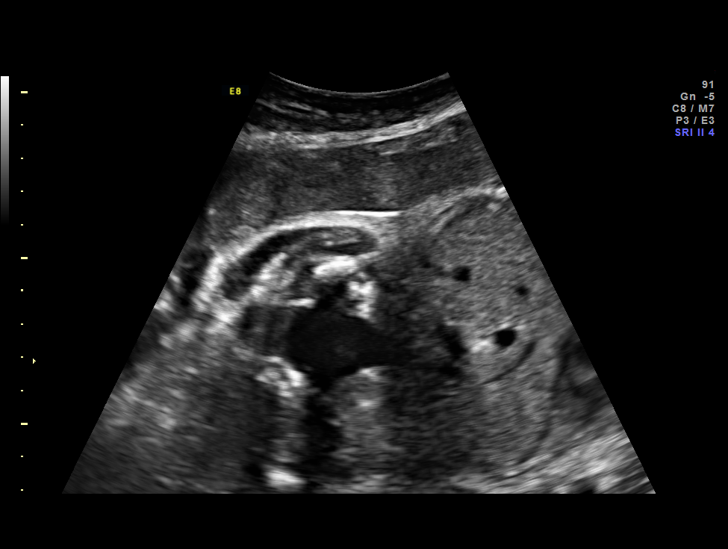
[im 21/38]
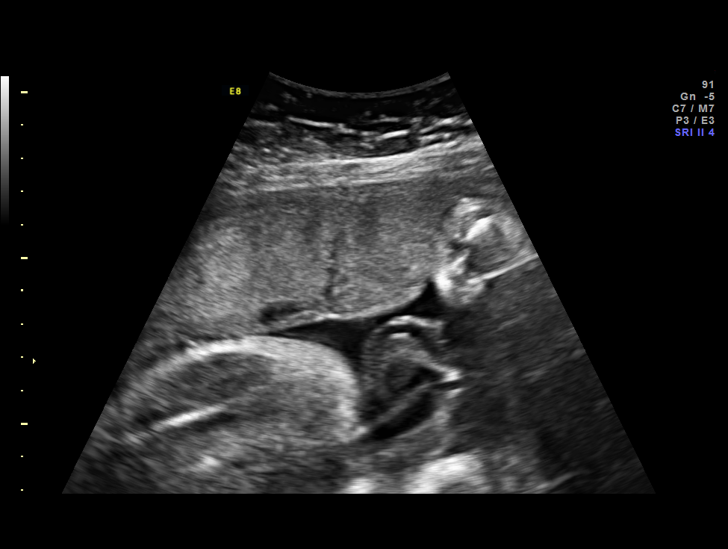
[im 24/38]
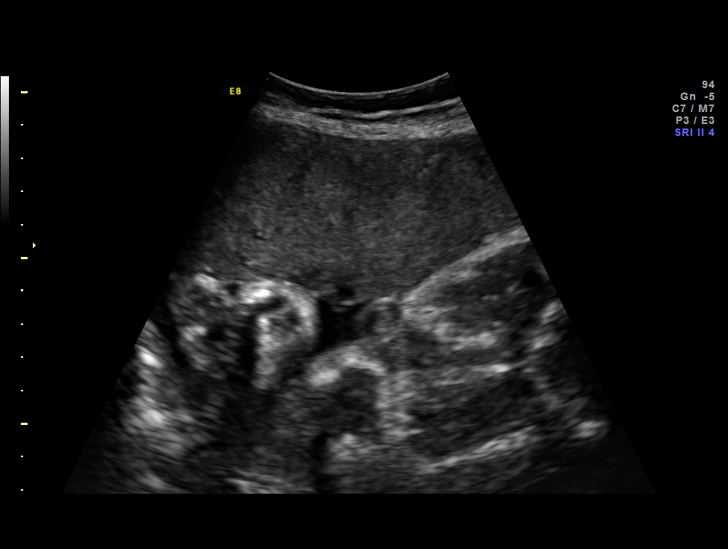
[im 27/38]
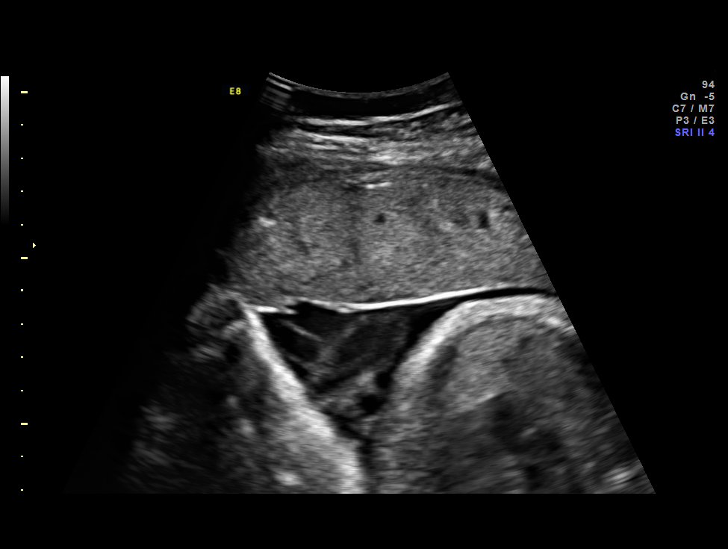
[im 31/38]
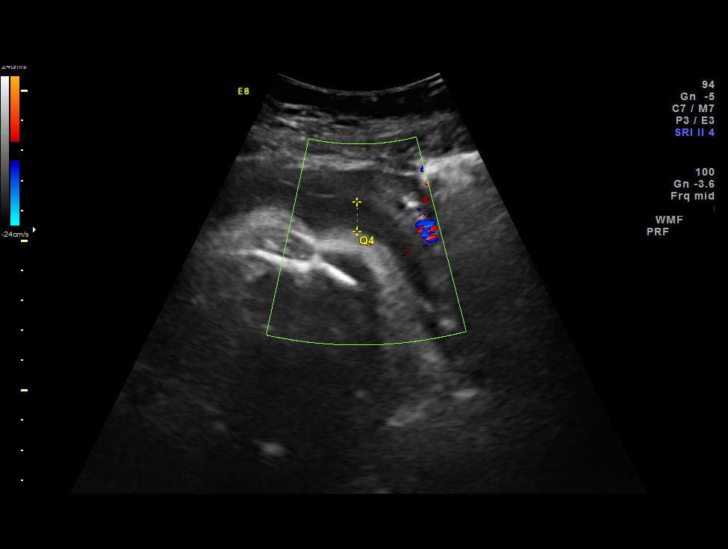
[im 33/38]
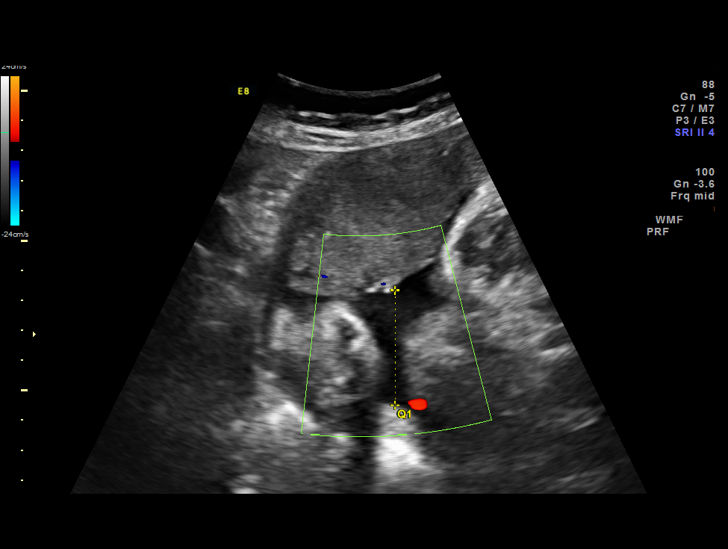
[im 36/38]
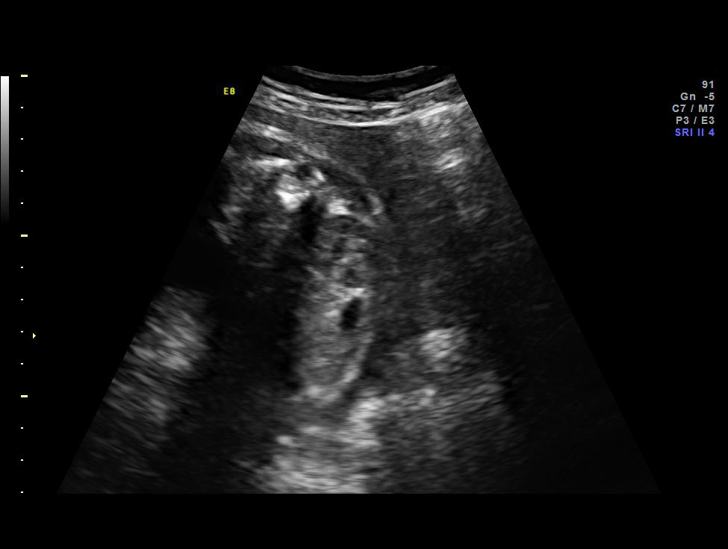

[12 of 28 positions shown; findings below may reference images not displayed]

OBSTETRICS REPORT
                      (Signed Final 05/13/2012 [DATE])

Service(s) Provided

 US OB FOLLOW UP                                       76816.1
Indications

 Fetal abnormality - other known or suspected
Fetal Evaluation

 Num Of Fetuses:    1
 Fetal Heart Rate:  157                          bpm
 Cardiac Activity:  Observed
 Presentation:      Cephalic
 Placenta:          Anterior, above cervical os
 P. Cord            Previously Visualized
 Insertion:

 Amniotic Fluid
 AFI FV:      Subjectively low-normal
 AFI Sum:     9.98    cm       21  %Tile     Larg Pckt:     4.4  cm
 RUQ:   3.83    cm   RLQ:    4.4    cm    LUQ:   0.75    cm   LLQ:    1      cm
Biometry

 BPD:     85.2  mm     G. Age:  34w 2d                CI:         80.1   70 - 86
 OFD:    106.4  mm                                    FL/HC:      21.0   20.1 -

 HC:     308.7  mm     G. Age:  34w 3d        9  %    HC/AC:      0.96   0.93 -

 AC:     323.1  mm     G. Age:  36w 1d       83  %    FL/BPD:     76.1   71 - 87
 FL:      64.8  mm     G. Age:  33w 3d        9  %    FL/AC:      20.1   20 - 24

 Est. FW:    0224  gm    5 lb 12 oz      61  %
Gestational Age

 U/S Today:     34w 4d                                        EDD:   06/20/12
 Best:          35w 1d     Det. By:  Early Ultrasound         EDD:   06/16/12
                                     (12/01/11)
Anatomy

 Cranium:          Appears normal         Aortic Arch:      Previously seen
 Fetal Cavum:      Previously seen        Ductal Arch:      Previously seen
 Ventricles:       Previously seen        Diaphragm:        Previously seen
 Choroid Plexus:   Previously seen        Stomach:          Appears normal, left
                                                            sided
 Cerebellum:       Previously seen        Abdomen:          Appears normal
 Posterior Fossa:  Previously seen        Abdominal Wall:   Previously seen
 Nuchal Fold:      Previously seen        Cord Vessels:     Previously seen
 Face:             Orbits and profile     Kidneys:          Appear normal
                   previously seen
 Lips:             Previously seen        Bladder:          Appears normal
 Heart:            Appears normal         Spine:            Previously seen
                   (4CH, axis, and
                   situs)
 RVOT:             Previously seen        Lower             Previously seen
                                          Extremities:
 LVOT:             Previously seen        Upper             Previously seen
                                          Extremities:

 Other:  Heels and 5th digit appear previously seen.
Cervix Uterus Adnexa

 Cervix:       Not visualized (advanced GA >02wks)
 Uterus:       No abnormality visualized.
 Cul De Sac:   No free fluid seen.

 Left Ovary:    Not visualized.
 Right Ovary:   Not visualized.
 Adnexa:     No abnormality visualized.
Comments

 Ms. Aujla is being followed due to echogenic bowel.
 TORCH titers have been completed and are negative for
 acute infection.
Impression

 IUP at 35 [DATE] weeks
 Normal interval anatomy; right renal pylectasis that was
 previously noted is not appreciated on today's study
 Subjectively low normal amniotic fluid volume
 Appropriate interval growth with EFW at the 61st %tile
Recommendations

 Follow-up ultrasounds as clinically indicated.

## 2017-03-16 NOTE — L&D Delivery Note (Signed)
Pt presented in active labor . She rapidly completed the first stage. She had a SVD over an intact perineum in the OA position. Placenta-S/I. EBL-[facility]cc. [facility]

## 2017-04-13 ENCOUNTER — Inpatient Hospital Stay (HOSPITAL_COMMUNITY)
Admission: AD | Admit: 2017-04-13 | Discharge: 2017-04-14 | Disposition: A | Discharge status: Home / Self Care | Payer: Medicaid Other | Source: Ambulatory Visit | Attending: Obstetrics and Gynecology | Admitting: Obstetrics and Gynecology

## 2017-04-13 ENCOUNTER — Encounter (HOSPITAL_COMMUNITY): Payer: Self-pay | Admitting: *Deleted

## 2017-04-13 DIAGNOSIS — K529 Noninfective gastroenteritis and colitis, unspecified: Secondary | ICD-10-CM | POA: Diagnosis not present

## 2017-04-13 DIAGNOSIS — O210 Mild hyperemesis gravidarum: Secondary | ICD-10-CM | POA: Diagnosis present

## 2017-04-13 DIAGNOSIS — Z3A16 16 weeks gestation of pregnancy: Secondary | ICD-10-CM | POA: Diagnosis not present

## 2017-04-13 DIAGNOSIS — O99612 Diseases of the digestive system complicating pregnancy, second trimester: Secondary | ICD-10-CM | POA: Diagnosis not present

## 2017-04-13 DIAGNOSIS — E860 Dehydration: Secondary | ICD-10-CM | POA: Diagnosis not present

## 2017-04-13 LAB — URINALYSIS, ROUTINE W REFLEX MICROSCOPIC
BACTERIA UA: NONE SEEN
Bilirubin Urine: NEGATIVE
Glucose, UA: NEGATIVE mg/dL
HGB URINE DIPSTICK: NEGATIVE
KETONES UR: 80 mg/dL — AB
LEUKOCYTES UA: NEGATIVE
NITRITE: NEGATIVE
PROTEIN: 30 mg/dL — AB
Specific Gravity, Urine: 1.031 — ABNORMAL HIGH (ref 1.005–1.030)
pH: 5 (ref 5.0–8.0)

## 2017-04-13 LAB — CBC WITH DIFFERENTIAL/PLATELET
Basophils Absolute: 0 10*3/uL (ref 0.0–0.1)
Basophils Relative: 0 %
EOS ABS: 0 10*3/uL (ref 0.0–0.7)
EOS PCT: 0 %
HCT: 35.8 % — ABNORMAL LOW (ref 36.0–46.0)
Hemoglobin: 13 g/dL (ref 12.0–15.0)
Lymphocytes Relative: 6 %
Lymphs Abs: 0.7 10*3/uL (ref 0.7–4.0)
MCH: 32.6 pg (ref 26.0–34.0)
MCHC: 36.3 g/dL — ABNORMAL HIGH (ref 30.0–36.0)
MCV: 89.7 fL (ref 78.0–100.0)
MONO ABS: 0.2 10*3/uL (ref 0.1–1.0)
Monocytes Relative: 2 %
Neutro Abs: 10.7 10*3/uL — ABNORMAL HIGH (ref 1.7–7.7)
Neutrophils Relative %: 92 %
Platelets: 228 10*3/uL (ref 150–400)
RBC: 3.99 MIL/uL (ref 3.87–5.11)
RDW: 12.4 % (ref 11.5–15.5)
WBC: 11.6 10*3/uL — ABNORMAL HIGH (ref 4.0–10.5)

## 2017-04-13 LAB — COMPREHENSIVE METABOLIC PANEL
ALT: 16 U/L (ref 14–54)
AST: 16 U/L (ref 15–41)
Albumin: 3.7 g/dL (ref 3.5–5.0)
Alkaline Phosphatase: 37 U/L — ABNORMAL LOW (ref 38–126)
Anion gap: 11 (ref 5–15)
BUN: 12 mg/dL (ref 6–20)
CO2: 18 mmol/L — AB (ref 22–32)
Calcium: 8.8 mg/dL — ABNORMAL LOW (ref 8.9–10.3)
Chloride: 104 mmol/L (ref 101–111)
Creatinine, Ser: 0.37 mg/dL — ABNORMAL LOW (ref 0.44–1.00)
GFR calc Af Amer: >60 mL/min (ref >60)
Glucose, Bld: 98 mg/dL (ref 65–99)
POTASSIUM: 3.6 mmol/L (ref 3.5–5.1)
SODIUM: 133 mmol/L — AB (ref 135–145)
Total Bilirubin: 1.2 mg/dL (ref 0.3–1.2)
Total Protein: 6.7 g/dL (ref 6.5–8.1)

## 2017-04-13 LAB — POCT PREGNANCY, URINE: PREG TEST UR: POSITIVE — AB

## 2017-04-13 MED ORDER — LACTATED RINGERS IV SOLN
INTRAVENOUS | Status: DC
  Administered 2017-04-13: 23:00:00 via INTRAVENOUS

## 2017-04-13 MED ORDER — DEXTROSE IN LACTATED RINGERS 5 % IV SOLN
INTRAVENOUS | Status: DC
  Administered 2017-04-13: via INTRAVENOUS

## 2017-04-13 MED ORDER — PROMETHAZINE HCL 25 MG/ML IJ SOLN
12.5000 mg | Freq: Once | INTRAMUSCULAR | Status: AC
  Administered 2017-04-13: 12.5 mg via INTRAVENOUS
  Filled 2017-04-13: qty 1

## 2017-04-13 NOTE — MAU Provider Note (Signed)
Chief Complaint:  Emesis   First Provider Initiated Contact with Patient 04/13/17 2226     HPI: [name] is a 32 y.o. G4P3003 at 2116w4dwho presents to maternity admissions reporting vomiting since this morning.  Had diarrhea yesterday and today.  All 3 of her children have similar symptoms.  [facility]  Denies fever, but "feels cold sometimes". She denies LOF, vaginal bleeding, vaginal itching/burning, urinary symptoms, h/a, dizziness, constipation or fever/chills.    Emesis   This is a new problem. The current episode started yesterday. The problem occurs intermittently. The problem has been unchanged. There has been no fever. Associated symptoms include diarrhea. Pertinent negatives include no abdominal pain, chills, coughing, headaches, myalgias or URI. Risk factors include ill contacts. She has tried nothing for the symptoms.    RN Note: PT SAYS SHE MORNING  SICKNESSS IN AFTERNOON-   BUT  THIS IS  DIFFERENT .  STARTED VOMITING AT 0800.  HAD DIARRHEA YESTERDAY.    SAYS ALL 3 KIDS ARE SICK.   PNC - APPOINTMENT IS  2-6-219   WITH  DR ROSS       Past Medical History: Past Medical History:  Diagnosis Date  . Hx of varicella   . Medical history non-contributory   . No pertinent past medical history     Past obstetric history: OB History  Gravida Para Term Preterm AB Living  4 3 3  0 0 3  SAB TAB Ectopic Multiple Live Births  0 0 0 0 3    # Outcome Date GA Lbr Len/2nd Weight Sex Delivery Anes PTL Lv  4 Current           3 Term 06/15/14 7574w0d / 00:36 7 lb 12.2 oz (3.521 kg) M Vag-Spont EPI  LIV  2 Term 06/06/12 4134w4d 03:39 / 01:56 6 lb 13.4 oz (3.1 kg) M Vag-Spont EPI  LIV  1 Term 12/28/10 8656w3d 15:45 / 00:37 6 lb 6.1 oz (2.895 kg) F Vag-Spont EPI  LIV     Birth Comments: none      Past Surgical History: Past Surgical History:  Procedure Laterality Date  . NO PAST SURGERIES      Family History: Family History  Family history unknown: Yes     Social History: Social History   Tobacco Use  . Smoking status: Never Smoker  . Smokeless tobacco: Never Used  Substance Use Topics  . Alcohol use: No  . Drug use: No    Allergies:  Allergies  Allergen Reactions  . Shellfish Allergy Nausea And Vomiting    Meds:  Medications Prior to Admission  Medication Sig Dispense Refill Last Dose  . Prenatal Vit-Fe Fumarate-FA (PRENATAL MULTIVITAMIN) TABS Take 1 tablet by mouth daily after lunch.   04/12/2017 at Unknown time  . ibuprofen (ADVIL,MOTRIN) 600 MG tablet Take 1 tablet (600 mg total) by mouth every 6 (six) hours as needed. 30 tablet 0   . oxyCODONE-acetaminophen (PERCOCET/ROXICET) 5-325 MG per tablet Take 1 tablet by mouth every 6 (six) hours as needed (for pain scale 4-7). 30 tablet 0     I have reviewed patient's Past Medical Hx, Surgical Hx, Family Hx, Social Hx, medications and allergies.   ROS:  Review of Systems  Constitutional: Negative for chills.  Respiratory: Negative for cough.   Gastrointestinal: Positive for diarrhea and vomiting. Negative for abdominal pain.  Musculoskeletal: Negative for myalgias.  Neurological: Negative for headaches.   Other systems negative  Physical Exam  Patient Vitals for the past 24 hrs:  BP Temp Temp src Pulse Resp Height Weight  04/13/17 2210 103/62 99.3 F (37.4 C) Oral (!) 101 20 5\' 2"  (1.575 m) 135 lb 12 oz (61.6 kg)   Constitutional: Well-developed, well-nourished female in no acute distress. Ill-appearing, however Cardiovascular: normal rate and rhythm Respiratory: normal effort, clear to auscultation bilaterally GI: Abd soft, non-tender, gravid appropriate for gestational age.   No rebound or guarding. MS: Extremities nontender, no edema, normal ROM Neurologic: Alert and oriented x 4.  GU: Neg CVAT.  PELVIC EXAM:  deferred  FHT:  152   Labs: Results for orders placed or performed during the hospital encounter of 04/13/17 (from the past 24 hour(s))   Urinalysis, Routine w reflex microscopic     Status: Abnormal   Collection Time: 04/13/17 10:12 PM  Result Value Ref Range   Color, Urine YELLOW YELLOW   APPearance HAZY (A) CLEAR   Specific Gravity, Urine 1.031 (H) 1.005 - 1.030   pH 5.0 5.0 - 8.0   Glucose, UA NEGATIVE NEGATIVE mg/dL   Hgb urine dipstick NEGATIVE NEGATIVE   Bilirubin Urine NEGATIVE NEGATIVE   Ketones, ur 80 (A) NEGATIVE mg/dL   Protein, ur 30 (A) NEGATIVE mg/dL   Nitrite NEGATIVE NEGATIVE   Leukocytes, UA NEGATIVE NEGATIVE   RBC / HPF 0-5 0 - 5 RBC/hpf   WBC, UA 0-5 0 - 5 WBC/hpf   Bacteria, UA NONE SEEN NONE SEEN   Squamous Epithelial / LPF 0-5 (A) NONE SEEN   Mucus PRESENT   Pregnancy, urine POC     Status: Abnormal   Collection Time: 04/13/17 10:17 PM  Result Value Ref Range   Preg Test, Ur POSITIVE (A) NEGATIVE  CBC with Differential/Platelet     Status: Abnormal   Collection Time: 04/13/17 10:37 PM  Result Value Ref Range   WBC 11.6 (H) 4.0 - 10.5 K/uL   RBC 3.99 3.87 - 5.11 MIL/uL   Hemoglobin 13.0 12.0 - 15.0 g/dL   HCT 14.7 (L) 82.9 - 56.2 %   MCV 89.7 78.0 - 100.0 fL   MCH 32.6 26.0 - 34.0 pg   MCHC 36.3 (H) 30.0 - 36.0 g/dL   RDW 13.0 86.5 - 78.4 %   Platelets 228 150 - 400 K/uL   Neutrophils Relative % 92 %   Neutro Abs 10.7 (H) 1.7 - 7.7 K/uL   Lymphocytes Relative 6 %   Lymphs Abs 0.7 0.7 - 4.0 K/uL   Monocytes Relative 2 %   Monocytes Absolute 0.2 0.1 - 1.0 K/uL   Eosinophils Relative 0 %   Eosinophils Absolute 0.0 0.0 - 0.7 K/uL   Basophils Relative 0 %   Basophils Absolute 0.0 0.0 - 0.1 K/uL  Comprehensive metabolic panel     Status: Abnormal   Collection Time: 04/13/17 10:37 PM  Result Value Ref Range   Sodium 133 (L) 135 - 145 mmol/L   Potassium 3.6 3.5 - 5.1 mmol/L   Chloride 104 101 - 111 mmol/L   CO2 18 (L) 22 - 32 mmol/L   Glucose, Bld 98 65 - 99 mg/dL   BUN 12 6 - 20 mg/dL   Creatinine, Ser 6.96 (L) 0.44 - 1.00 mg/dL   Calcium 8.8 (L) 8.9 - 10.3 mg/dL   Total Protein  6.7 6.5 - 8.1 g/dL   Albumin 3.7 3.5 - 5.0 g/dL   AST 16 15 - 41 U/L   ALT 16 14 - 54 U/L   Alkaline Phosphatase  37 (L) 38 - 126 U/L   Total Bilirubin 1.2 0.3 - 1.2 mg/dL   GFR calc non Af Amer >60 >60 mL/min   GFR calc Af Amer >60 >60 mL/min   Anion gap 11 5 - 15    Imaging:  No results found.  MAU Course/MDM: I have ordered labs and reviewed results. Urine showed some dehydration. Potassium within normal limits. WBC not significantly elevated.  Suspect viral gastroenteritis, given sick contacts   Treatments in MAU included IV hydration with 2 liters and Phenergan which did help her nausea. No vomiting while here.    Assessment: Gastroenteritis, likely viral Mild dehydration  Plan: Discharge home Rx Phenergan for home PRN use Encouraged to return here or to other Urgent Care/ED if she develops worsening of symptoms, increase in pain, fever, or other concerning symptoms.   Follow up in Office for prenatal visits and recheck of status.   Pt stable at time of discharge.  Wynelle Bourgeois CNM, MSN Certified Nurse-Midwife 04/13/2017 10:26 PM

## 2017-04-13 NOTE — MAU Note (Signed)
PT SAYS SHE MORNING  SICKNESSS IN AFTERNOON-   BUT  THIS IS  DIFFERENT .  STARTED VOMITING AT 0800.  HAD DIARRHEA YESTERDAY.    SAYS ALL 3 KIDS ARE SICK.   PNC - APPOINTMENT IS  2-6-219   WITH  DR Tenny CrawOSS

## 2017-04-14 DIAGNOSIS — K529 Noninfective gastroenteritis and colitis, unspecified: Secondary | ICD-10-CM

## 2017-04-14 MED ORDER — PROMETHAZINE HCL 25 MG PO TABS: 25.0000 mg | ORAL_TABLET | Freq: Four times a day (QID) | ORAL | 2 refills | Status: DC | PRN

## 2017-04-14 NOTE — Discharge Instructions (Signed)
Viral Gastroenteritis, Adult Viral gastroenteritis is also known as the stomach flu. This condition is caused by certain germs (viruses). These germs can be passed from person to person very easily (are very contagious). This condition can cause sudden watery poop (diarrhea), fever, and throwing up (vomiting). Having watery poop and throwing up can make you feel weak and cause you to get dehydrated. Dehydration can make you tired and thirsty, make you have a dry mouth, and make it so you pee (urinate) less often. Older adults and people with other diseases or a weak defense system (immune system) are at higher risk for dehydration. It is important to replace the fluids that you lose from having watery poop and throwing up. Follow these instructions at home: Follow instructions from your doctor about how to care for yourself at home. Eating and drinking  Follow these instructions as told by your doctor:  Take an oral rehydration solution (ORS). This is a drink that is sold at pharmacies and stores.  Drink clear fluids in small amounts as you are able, such as: ? Water. ? Ice chips. ? Diluted fruit juice. ? Low-calorie sports drinks.  Eat bland, easy-to-digest foods in small amounts as you are able, such as: ? Bananas. ? Applesauce. ? Rice. ? Low-fat (lean) meats. ? Toast. ? Crackers.  Avoid fluids that have a lot of sugar or caffeine in them.  Avoid alcohol.  Avoid spicy or fatty foods.  Promethazine tablets: Take your medication if needed for nausea and vomiting. The Promethazine will help with this but may make you sleepy. Do not drive within 6 hours of taking it.   General instructions  Drink enough fluid to keep your pee (urine) clear or pale yellow.  Wash your hands often. If you cannot use soap and water, use hand sanitizer.  Make sure that all people in your home wash their hands well and often.  Rest at home while you get better.  Take over-the-counter and  prescription medicines only as told by your doctor.  Watch your condition for any changes.  Take a warm bath to help with any burning or pain from having watery poop.  Keep all follow-up visits as told by your doctor. This is important. Contact a doctor if:  You cannot keep fluids down.  Your symptoms get worse.  You have new symptoms.  You feel light-headed or dizzy.  You have muscle cramps. Get help right away if:  You have chest pain.  You feel very weak or you pass out (faint).  You see blood in your throw-up.  Your throw-up looks like coffee grounds.  You have bloody or black poop (stools) or poop that look like tar.  You have a very bad headache, a stiff neck, or both.  You have a rash.  You have very bad pain, cramping, or bloating in your belly (abdomen).  You have trouble breathing.  You are breathing very quickly.  Your heart is beating very quickly.  Your skin feels cold and clammy.  You feel confused.  You have pain when you pee.  You have signs of dehydration, such as: ? Dark pee, hardly any pee, or no pee. ? Cracked lips. ? Dry mouth. ? Sunken eyes. ? Sleepiness. ? Weakness. This information is not intended to replace advice given to you by your health care provider. Make sure you discuss any questions you have with your health care provider. Document Released: 08/19/2007 Document Revised: 09/20/2015 Document Reviewed: 11/06/2014 Elsevier Interactive Patient Education  2017 [place].

## 2017-04-19 LAB — OB RESULTS CONSOLE ABO/RH: RH TYPE: POSITIVE

## 2017-04-19 LAB — OB RESULTS CONSOLE HEPATITIS B SURFACE ANTIGEN: HEP B S AG: NEGATIVE

## 2017-04-19 LAB — OB RESULTS CONSOLE RUBELLA ANTIBODY, IGM: RUBELLA: IMMUNE

## 2017-04-19 LAB — OB RESULTS CONSOLE GC/CHLAMYDIA
Chlamydia: NEGATIVE
Gonorrhea: NEGATIVE

## 2017-04-19 LAB — OB RESULTS CONSOLE ANTIBODY SCREEN: Antibody Screen: NEGATIVE

## 2017-04-19 LAB — OB RESULTS CONSOLE HIV ANTIBODY (ROUTINE TESTING): HIV: NONREACTIVE

## 2017-04-19 LAB — OB RESULTS CONSOLE RPR: RPR: NONREACTIVE

## 2017-08-18 LAB — OB RESULTS CONSOLE GBS: STREP GROUP B AG: NEGATIVE

## 2017-09-21 ENCOUNTER — Inpatient Hospital Stay (HOSPITAL_COMMUNITY)
Admission: AD | Admit: 2017-09-21 | Discharge: 2017-09-23 | DRG: 807 | Disposition: A | Discharge status: Home / Self Care | Payer: Medicaid Other | Attending: Obstetrics and Gynecology | Admitting: Obstetrics and Gynecology

## 2017-09-21 ENCOUNTER — Encounter (HOSPITAL_COMMUNITY): Payer: Self-pay

## 2017-09-21 DIAGNOSIS — Z3A39 39 weeks gestation of pregnancy: Secondary | ICD-10-CM | POA: Diagnosis not present

## 2017-09-21 DIAGNOSIS — Z3483 Encounter for supervision of other normal pregnancy, third trimester: Secondary | ICD-10-CM | POA: Diagnosis present

## 2017-09-21 DIAGNOSIS — Z3490 Encounter for supervision of normal pregnancy, unspecified, unspecified trimester: Secondary | ICD-10-CM

## 2017-09-21 LAB — CBC
HEMATOCRIT: 37.6 % (ref 36.0–46.0)
Hemoglobin: 13.3 g/dL (ref 12.0–15.0)
MCH: 31.7 pg (ref 26.0–34.0)
MCHC: 35.4 g/dL (ref 30.0–36.0)
MCV: 89.7 fL (ref 78.0–100.0)
Platelets: 248 10*3/uL (ref 150–400)
RBC: 4.19 MIL/uL (ref 3.87–5.11)
RDW: 12.7 % (ref 11.5–15.5)
WBC: 10.7 10*3/uL — ABNORMAL HIGH (ref 4.0–10.5)

## 2017-09-21 MED ORDER — OXYCODONE-ACETAMINOPHEN 5-325 MG PO TABS: 2.0000 | ORAL_TABLET | ORAL | Status: DC | PRN

## 2017-09-21 MED ORDER — OXYCODONE-ACETAMINOPHEN 5-325 MG PO TABS
1.0000 | ORAL_TABLET | ORAL | Status: DC | PRN
  Administered 2017-09-22: 1 via ORAL
  Filled 2017-09-21: qty 1

## 2017-09-21 MED ORDER — EPHEDRINE 5 MG/ML INJ
10.0000 mg | INTRAVENOUS | Status: DC | PRN
Start: 2017-09-21 — End: 2017-09-22

## 2017-09-21 MED ORDER — MISOPROSTOL 200 MCG PO TABS
800.0000 ug | ORAL_TABLET | ORAL | Status: AC
  Administered 2017-09-22: 800 ug via RECTAL

## 2017-09-21 MED ORDER — LACTATED RINGERS IV SOLN: 500.0000 mL | INTRAVENOUS | Status: DC | PRN

## 2017-09-21 MED ORDER — PHENYLEPHRINE 40 MCG/ML (10ML) SYRINGE FOR IV PUSH (FOR BLOOD PRESSURE SUPPORT): 80.0000 ug | PREFILLED_SYRINGE | INTRAVENOUS | Status: DC | PRN

## 2017-09-21 MED ORDER — LACTATED RINGERS IV SOLN: INTRAVENOUS | Status: DC

## 2017-09-21 MED ORDER — MISOPROSTOL 200 MCG PO TABS: 800.0000 ug | ORAL_TABLET | ORAL | Status: DC

## 2017-09-21 MED ORDER — FLEET ENEMA 7-19 GM/118ML RE ENEM: 1.0000 | ENEMA | RECTAL | Status: DC | PRN

## 2017-09-21 MED ORDER — EPHEDRINE 5 MG/ML INJ: 10.0000 mg | INTRAVENOUS | Status: DC | PRN

## 2017-09-21 MED ORDER — LACTATED RINGERS IV SOLN
500.0000 mL | Freq: Once | INTRAVENOUS | Status: AC
  Administered 2017-09-21: 500 mL via INTRAVENOUS

## 2017-09-21 MED ORDER — SOD CITRATE-CITRIC ACID 500-334 MG/5ML PO SOLN: 30.0000 mL | ORAL | Status: DC | PRN

## 2017-09-21 MED ORDER — FENTANYL 2.5 MCG/ML BUPIVACAINE 1/10 % EPIDURAL INFUSION (WH - ANES): 14.0000 mL/h | INTRAMUSCULAR | Status: DC | PRN

## 2017-09-21 MED ORDER — OXYTOCIN 40 UNITS IN LACTATED RINGERS INFUSION - SIMPLE MED: 2.5000 [IU]/h | INTRAVENOUS | Status: DC

## 2017-09-21 MED ORDER — DIPHENHYDRAMINE HCL 50 MG/ML IJ SOLN: 12.5000 mg | INTRAMUSCULAR | Status: DC | PRN

## 2017-09-21 MED ORDER — LIDOCAINE HCL (PF) 1 % IJ SOLN
INTRAMUSCULAR | Status: AC
  Filled 2017-09-21: qty 30

## 2017-09-21 MED ORDER — OXYTOCIN 10 UNIT/ML IJ SOLN
INTRAMUSCULAR | Status: AC
  Filled 2017-09-21: qty 1

## 2017-09-21 MED ORDER — LIDOCAINE HCL (PF) 1 % IJ SOLN: 30.0000 mL | INTRAMUSCULAR | Status: DC | PRN

## 2017-09-21 MED ORDER — MISOPROSTOL 200 MCG PO TABS
ORAL_TABLET | ORAL | Status: AC
  Filled 2017-09-21: qty 4

## 2017-09-21 MED ORDER — OXYTOCIN BOLUS FROM INFUSION
500.0000 mL | Freq: Once | INTRAVENOUS | Status: AC
  Administered 2017-09-21: 500 mL via INTRAVENOUS

## 2017-09-21 MED ORDER — OXYTOCIN 40 UNITS IN LACTATED RINGERS INFUSION - SIMPLE MED
INTRAVENOUS | Status: AC
  Administered 2017-09-21: 500 mL via INTRAVENOUS
  Filled 2017-09-21: qty 1000

## 2017-09-21 MED ORDER — ONDANSETRON HCL 4 MG/2ML IJ SOLN: 4.0000 mg | Freq: Four times a day (QID) | INTRAMUSCULAR | Status: DC | PRN

## 2017-09-21 MED ORDER — ACETAMINOPHEN 325 MG PO TABS: 650.0000 mg | ORAL_TABLET | ORAL | Status: DC | PRN

## 2017-09-21 NOTE — H&P (Signed)
Pt is an 32 y.o. [phone] 4575w4d female who presented to the ER in active labor. She was 8cm on admission. PNC was uncomplicated. Quad screen was neg   Past Medical History:  Diagnosis Date  . Hx of varicella   . Medical history non-contributory   . No pertinent past medical history     Past Surgical History:  Procedure Laterality Date  . NO PAST SURGERIES      Family History  Family history unknown: Yes   Social History:  reports that she has never smoked. She has never used smokeless tobacco. She reports that she does not drink alcohol or use drugs.  Allergies:  Allergies  Allergen Reactions  . Shellfish Allergy Nausea And Vomiting    Medications Prior to Admission  Medication Sig Dispense Refill  . Prenatal Vit-Fe Fumarate-FA (PRENATAL MULTIVITAMIN) TABS Take 1 tablet by mouth daily after lunch.    . promethazine (PHENERGAN) 25 MG tablet Take 1 tablet (25 mg total) by mouth every 6 (six) hours as needed for nausea or vomiting. 30 tablet 2       Temperature 97.8 F (36.6 C), temperature source Oral, height 5\' 2"  (1.575 m), weight 152 lb (68.9 kg), last menstrual period 12/18/2016, unknown if currently breastfeeding. General appearance: alert and cooperative Abdomen: gravid, nontender   Lab Results  Component Value Date   WBC 10.7 (H) 09/21/2017   HGB 13.3 09/21/2017   HCT 37.6 09/21/2017   MCV 89.7 09/21/2017   PLT 248 09/21/2017   Lab Results  Component Value Date   PREGTESTUR POSITIVE (A) 04/13/2017       Patient Active Problem List   Diagnosis Date Noted  . Indication for care in labor or delivery 09/21/2017  . Normal labor and delivery 06/15/2014  . SVD (spontaneous vaginal delivery) 06/15/2014   IMP/ IUP at term in labor Plan/ Admit  [name] E 09/21/2017, 11:48 PM

## 2017-09-21 NOTE — MAU Note (Signed)
Pt reports contractions every 2-3 mins. Cervix was 3cm on Friday. Pt denies LOF but reports a lot of bloody show. GBS-

## 2017-09-22 ENCOUNTER — Encounter (HOSPITAL_COMMUNITY): Payer: Self-pay | Admitting: *Deleted

## 2017-09-22 DIAGNOSIS — Z3490 Encounter for supervision of normal pregnancy, unspecified, unspecified trimester: Secondary | ICD-10-CM

## 2017-09-22 LAB — CBC
HCT: 35.1 % — ABNORMAL LOW (ref 36.0–46.0)
Hemoglobin: 12.3 g/dL (ref 12.0–15.0)
MCH: 31.7 pg (ref 26.0–34.0)
MCHC: 35 g/dL (ref 30.0–36.0)
MCV: 90.5 fL (ref 78.0–100.0)
Platelets: 218 10*3/uL (ref 150–400)
RBC: 3.88 MIL/uL (ref 3.87–5.11)
RDW: 12.8 % (ref 11.5–15.5)
WBC: 13.9 10*3/uL — AB (ref 4.0–10.5)

## 2017-09-22 LAB — TYPE AND SCREEN
ABO/RH(D): B POS
Antibody Screen: NEGATIVE

## 2017-09-22 LAB — RPR: RPR: NONREACTIVE

## 2017-09-22 MED ORDER — COCONUT OIL OIL
1.0000 "application " | TOPICAL_OIL | Status: DC | PRN
  Filled 2017-09-22: qty 120

## 2017-09-22 MED ORDER — ONDANSETRON HCL 4 MG PO TABS: 4.0000 mg | ORAL_TABLET | ORAL | Status: DC | PRN

## 2017-09-22 MED ORDER — MEASLES, MUMPS & RUBELLA VAC ~~LOC~~ INJ
0.5000 mL | INJECTION | Freq: Once | SUBCUTANEOUS | Status: DC
  Filled 2017-09-22: qty 0.5

## 2017-09-22 MED ORDER — SIMETHICONE 80 MG PO CHEW: 80.0000 mg | CHEWABLE_TABLET | ORAL | Status: DC | PRN

## 2017-09-22 MED ORDER — DIBUCAINE 1 % RE OINT
1.0000 "application " | TOPICAL_OINTMENT | RECTAL | Status: DC | PRN
  Filled 2017-09-22: qty 28

## 2017-09-22 MED ORDER — IBUPROFEN 600 MG PO TABS
600.0000 mg | ORAL_TABLET | Freq: Four times a day (QID) | ORAL | Status: DC
  Administered 2017-09-22 – 2017-09-23 (×7): 600 mg via ORAL
  Filled 2017-09-22 (×7): qty 1

## 2017-09-22 MED ORDER — ACETAMINOPHEN 325 MG PO TABS: 650.0000 mg | ORAL_TABLET | ORAL | Status: DC | PRN

## 2017-09-22 MED ORDER — ONDANSETRON HCL 4 MG/2ML IJ SOLN: 4.0000 mg | INTRAMUSCULAR | Status: DC | PRN

## 2017-09-22 MED ORDER — ZOLPIDEM TARTRATE 5 MG PO TABS: 5.0000 mg | ORAL_TABLET | Freq: Every evening | ORAL | Status: DC | PRN

## 2017-09-22 MED ORDER — WITCH HAZEL-GLYCERIN EX PADS
1.0000 "application " | MEDICATED_PAD | CUTANEOUS | Status: DC | PRN
  Administered 2017-09-23: 1 via TOPICAL

## 2017-09-22 MED ORDER — TETANUS-DIPHTH-ACELL PERTUSSIS 5-2.5-18.5 LF-MCG/0.5 IM SUSP: 0.5000 mL | Freq: Once | INTRAMUSCULAR | Status: DC

## 2017-09-22 MED ORDER — BENZOCAINE-MENTHOL 20-0.5 % EX AERO
1.0000 "application " | INHALATION_SPRAY | CUTANEOUS | Status: DC | PRN
  Administered 2017-09-22: 1 via TOPICAL
  Filled 2017-09-22 (×2): qty 56

## 2017-09-22 MED ORDER — SENNOSIDES-DOCUSATE SODIUM 8.6-50 MG PO TABS
2.0000 | ORAL_TABLET | ORAL | Status: DC
  Administered 2017-09-22: 2 via ORAL
  Filled 2017-09-22: qty 2

## 2017-09-22 NOTE — Plan of Care (Signed)
Progressing appropriately. Encouraged to call for assistance as needed, and for LATCH assessment. Safety information and room orientation complete. 

## 2017-09-22 NOTE — Progress Notes (Signed)
Patient is eating, ambulating, voiding.  Pain control is good.  Appropriate lochia. No complaints.  Vitals:   09/22/17 0059 09/22/17 0120 09/22/17 0238 09/22/17 0637  BP: (!) 104/58 108/71 108/70 (!) 95/58  Pulse: 72 (!) 52 61 (!) 57  Resp:  18 18 18   Temp:  98.3 F (36.8 C) 98.1 F (36.7 C) 98.3 F (36.8 C)  TempSrc:  Oral Oral Oral  Weight:      Height:        Fundus firm Ext: no CT  Lab Results  Component Value Date   WBC 13.9 (H) 09/22/2017   HGB 12.3 09/22/2017   HCT 35.1 (L) 09/22/2017   MCV 90.5 09/22/2017   PLT 218 09/22/2017    --/--/B POS (07/09 2304)  A/P Postpartum day #1 Doing well.  Routine care.  Expect d/c 7/11.    Philip AspenALLAHAN, Tavon Magnussen

## 2017-09-23 ENCOUNTER — Ambulatory Visit: Payer: Self-pay

## 2017-09-23 LAB — BIRTH TISSUE RECOVERY COLLECTION (PLACENTA DONATION)

## 2017-09-23 NOTE — Progress Notes (Signed)
Patient is eating, ambulating, voiding.  Pain control is good.  Vitals:   09/22/17 1039 09/22/17 1428 09/22/17 2330 09/23/17 0519  BP: 91/64 105/72 (!) 96/51 104/71  Pulse: 60 72 63 63  Resp: 18 18 18 18   Temp: 98.2 F (36.8 C) 98.5 F (36.9 C) 98.2 F (36.8 C) 97.7 F (36.5 C)  TempSrc: Axillary Oral Oral Oral  SpO2: 98% 99%    Weight:      Height:        Fundus firm Perineum without swelling.  Lab Results  Component Value Date   WBC 13.9 (H) 09/22/2017   HGB 12.3 09/22/2017   HCT 35.1 (L) 09/22/2017   MCV 90.5 09/22/2017   PLT 218 09/22/2017    --/--/B POS (07/09 2304)/RI  A/P Post partum day 2.  Routine care.  Expect d/c today.    [name] A

## 2017-09-23 NOTE — Discharge Summary (Signed)
Obstetric Discharge Summary Reason for Admission: onset of labor Prenatal Procedures: none Intrapartum Procedures: spontaneous vaginal delivery Postpartum Procedures: none Complications-Operative and Postpartum: none Hemoglobin  Date Value Ref Range Status  09/22/2017 12.3 12.0 - 15.0 g/dL Final   HCT  Date Value Ref Range Status  09/22/2017 35.1 (L) 36.0 - 46.0 % Final     Discharge Diagnoses: Term Pregnancy-delivered  Discharge Information: Date: 09/23/2017 Activity: pelvic rest Diet: routine Medications: Ibuprofen Condition: stable Instructions: refer to practice specific booklet Discharge to: home Follow-up Information    Levi AlandAnderson, Mark E, MD Follow up in 4 week(s).   Specialty:  Obstetrics and Gynecology Contact information: [address] [place] [phone]           Newborn Data: Live born female  Birth Weight: 6 lb 11.8 oz (3056 g) APGAR: 8, 10  Newborn Delivery   Birth date/time:  09/21/2017 23:36:00 Delivery type:  Vaginal, Spontaneous     Home with mother.  Kelso Bibby A 09/23/2017, 7:45 AM

## 2017-09-23 NOTE — Lactation Note (Signed)
This note was copied from a baby's chart. Lactation Consultation Note Baby 30 hrs old. Mom's 4th child BF her other 3 children for at least 1 yr w/o difficulty. Ages now 47,5,33 yrs old. Mom states having no difficulty BF this baby, denies painful latching and has good I&O. Mom states being d/c today. Reviewed engorgement.  Mom states has no questions or needs of assistance. WH/LC brochure given w/resources, support groups and LC services. Patient Name: [name] OZHYQ'MToday's Date: 09/23/2017 Reason for consult: Initial assessment   Maternal Data Has patient been taught Hand Expression?: Yes Does the patient have breastfeeding experience prior to this delivery?: Yes  Feeding Feeding Type: Breast Fed Length of feed: 10 min  LATCH Score                   Interventions Interventions: Breast feeding basics reviewed  Lactation Tools Discussed/Used     Consult Status Consult Status: Complete Date: 09/23/17    Charyl DancerCARVER, [name] 09/23/2017, 6:00 AM

## 2017-09-23 NOTE — Lactation Note (Signed)
This note was copied from a baby's chart. Lactation Consultation Note: Mother request hand pump. Mother was given a hand pump and observed using a #24 flange. Mother was given comfort gels by staff nurse. Observed that mothers nipples are slightly pink . Advised to rotate positions frequently.  Mother receptive to teaching. She denies having any breastfeeding concerns or questions.  Patient Name: Kelli Cherylann RatelShadieh Colgan WUJWJ'XToday's Date: 09/23/2017 Reason for consult: Follow-up assessment   Maternal Data    Feeding Feeding Type: Breast Fed Length of feed: 10 min  LATCH Score Latch: Grasps breast easily, tongue down, lips flanged, rhythmical sucking.  Audible Swallowing: A few with stimulation  Type of Nipple: Everted at rest and after stimulation  Comfort (Breast/Nipple): Soft / non-tender  Hold (Positioning): No assistance needed to correctly position infant at breast.  LATCH Score: 9  Interventions Interventions: Hand pump  Lactation Tools Discussed/Used     Consult Status Consult Status: Complete    Michel BickersKendrick, Donley Harland McCoy 09/23/2017, 12:45 PM

## 2020-07-25 ENCOUNTER — Encounter (HOSPITAL_BASED_OUTPATIENT_CLINIC_OR_DEPARTMENT_OTHER): Payer: Self-pay

## 2020-07-25 ENCOUNTER — Other Ambulatory Visit: Payer: Self-pay

## 2020-07-25 ENCOUNTER — Emergency Department (HOSPITAL_BASED_OUTPATIENT_CLINIC_OR_DEPARTMENT_OTHER)
Admission: EM | Admit: 2020-07-25 | Discharge: 2020-07-25 | Disposition: A | Discharge status: Home / Self Care | Payer: Medicaid Other | Attending: Emergency Medicine | Admitting: Emergency Medicine

## 2020-07-25 ENCOUNTER — Emergency Department (HOSPITAL_BASED_OUTPATIENT_CLINIC_OR_DEPARTMENT_OTHER): Payer: Medicaid Other

## 2020-07-25 DIAGNOSIS — G43909 Migraine, unspecified, not intractable, without status migrainosus: Secondary | ICD-10-CM | POA: Diagnosis present

## 2020-07-25 DIAGNOSIS — J019 Acute sinusitis, unspecified: Secondary | ICD-10-CM

## 2020-07-25 DIAGNOSIS — G43809 Other migraine, not intractable, without status migrainosus: Secondary | ICD-10-CM

## 2020-07-25 MED ORDER — SODIUM CHLORIDE 0.9 % IV BOLUS
1000.0000 mL | Freq: Once | INTRAVENOUS | Status: AC
  Administered 2020-07-25: 1000 mL via INTRAVENOUS

## 2020-07-25 MED ORDER — KETOROLAC TROMETHAMINE 30 MG/ML IJ SOLN
30.0000 mg | Freq: Once | INTRAMUSCULAR | Status: AC
  Administered 2020-07-25: 30 mg via INTRAVENOUS
  Filled 2020-07-25: qty 1

## 2020-07-25 MED ORDER — DEXAMETHASONE SODIUM PHOSPHATE 10 MG/ML IJ SOLN
10.0000 mg | Freq: Once | INTRAMUSCULAR | Status: AC
  Administered 2020-07-25: 10 mg via INTRAVENOUS
  Filled 2020-07-25: qty 1

## 2020-07-25 MED ORDER — METOCLOPRAMIDE HCL 5 MG/ML IJ SOLN
10.0000 mg | Freq: Once | INTRAMUSCULAR | Status: AC
  Administered 2020-07-25: 10 mg via INTRAVENOUS
  Filled 2020-07-25: qty 2

## 2020-07-25 MED ORDER — AZITHROMYCIN 250 MG PO TABS: 250.0000 mg | ORAL_TABLET | Freq: Every day | ORAL | 0 refills | Status: DC

## 2020-07-25 MED ORDER — AZITHROMYCIN 250 MG PO TABS
500.0000 mg | ORAL_TABLET | Freq: Once | ORAL | Status: AC
  Administered 2020-07-25: 500 mg via ORAL
  Filled 2020-07-25: qty 2

## 2020-07-25 MED ORDER — DIPHENHYDRAMINE HCL 50 MG/ML IJ SOLN
25.0000 mg | Freq: Once | INTRAMUSCULAR | Status: AC
  Administered 2020-07-25: 25 mg via INTRAVENOUS
  Filled 2020-07-25: qty 1

## 2020-07-25 NOTE — Discharge Instructions (Addendum)
Begin taking Zithromax as prescribed.  Take ibuprofen 600 mg rotated with Tylenol 1000 mg every 4 hours as needed for pain.  Follow-up with primary doctor if not improving in the next few days.

## 2020-07-25 NOTE — ED Provider Notes (Signed)
[facility] EMERGENCY DEPARTMENT Provider Note   CSN: [id number] Arrival date & time: 07/25/20  0112     History Chief Complaint  Patient presents with  . Migraine    [name] is a 35 y.o. female.  Patient is a 35 year old female with no significant past medical history.  She presents today for evaluation of headache.  She describes a constant headache that has been present for the past 3 days.  She describes a pressure to the front of her head that is worse with light and noise.  She denies any fever, neck pain, or other complaints.  The history is provided by the patient.  Migraine This is a new problem. Episode onset: 3 days ago. The problem occurs constantly. The problem has been gradually worsening. Nothing relieves the symptoms. She has tried nothing for the symptoms.       Past Medical History:  Diagnosis Date  . Hx of varicella   . Medical history non-contributory   . No pertinent past medical history     Patient Active Problem List   Diagnosis Date Noted  . Pregnancy 09/22/2017  . Indication for care in labor or delivery 09/21/2017  . Normal labor and delivery 06/15/2014  . SVD (spontaneous vaginal delivery) 06/15/2014    Past Surgical History:  Procedure Laterality Date  . NO PAST SURGERIES       OB History    Gravida  4   Para  4   Term  4   Preterm  0   AB  0   Living  4     SAB  0   IAB  0   Ectopic  0   Multiple  0   Live Births  4           Family History  Family history unknown: Yes    Social History   Tobacco Use  . Smoking status: Never Smoker  . Smokeless tobacco: Never Used  Substance Use Topics  . Alcohol use: No  . Drug use: No    Home Medications Prior to Admission medications   Medication Sig Start Date End Date Taking? Authorizing Provider  Prenatal Vit-Fe Fumarate-FA (PRENATAL MULTIVITAMIN) TABS Take 1 tablet by mouth daily after lunch.    [provider]    Allergies     Shellfish allergy  Review of Systems   Review of Systems  All other systems reviewed and are negative.   Physical Exam Updated Vital Signs BP 118/65 (BP Location: Right Arm)   Pulse 99   Temp 98.9 F (37.2 C) (Oral)   Resp 16   Ht 5\' 2"  (1.575 m)   Wt 63.5 kg   LMP 07/21/2020   SpO2 100%   BMI 25.61 kg/m   Physical Exam Vitals and nursing note reviewed.  Constitutional:      General: She is not in acute distress.    Appearance: She is well-developed. She is not diaphoretic.  HENT:     Head: Normocephalic and atraumatic.  Eyes:     Extraocular Movements: Extraocular movements intact.     Pupils: Pupils are equal, round, and reactive to light.     Comments: There is no papilledema upon limited funduscopic exam  Cardiovascular:     Rate and Rhythm: Normal rate and regular rhythm.     Heart sounds: No murmur heard. No friction rub. No gallop.   Pulmonary:     Effort: Pulmonary effort is normal. No respiratory distress.  Breath sounds: Normal breath sounds. No wheezing.  Abdominal:     General: Bowel sounds are normal. There is no distension.     Palpations: Abdomen is soft.     Tenderness: There is no abdominal tenderness.  Musculoskeletal:        General: Normal range of motion.     Cervical back: Normal range of motion and neck supple.  Skin:    General: Skin is warm and dry.  Neurological:     General: No focal deficit present.     Mental Status: She is alert and oriented to person, place, and time.     Cranial Nerves: No cranial nerve deficit.     Sensory: No sensory deficit.     Motor: No weakness.     Coordination: Coordination normal.     ED Results / Procedures / Treatments   Labs (all labs ordered are listed, but only abnormal results are displayed) Labs Reviewed - No data to display  EKG None  Radiology No results found.  Procedures Procedures   Medications Ordered in ED Medications  sodium chloride 0.9 % bolus 1,000 mL (has no  administration in time range)  ketorolac (TORADOL) 30 MG/ML injection 30 mg (has no administration in time range)  dexamethasone (DECADRON) injection 10 mg (has no administration in time range)  metoCLOPramide (REGLAN) injection 10 mg (has no administration in time range)  diphenhydrAMINE (BENADRYL) injection 25 mg (has no administration in time range)    ED Course  I have reviewed the triage vital signs and the nursing notes.  Pertinent labs & imaging results that were available during my care of the patient were reviewed by me and considered in my medical decision making (see chart for details).    MDM Rules/Calculators/A&P  Patient presenting with complaints of headache for the past 3 days.  Has been consistent despite ibuprofen.  Patient is neurologically intact with head CT showing only sinus disease.  She is afebrile with full range of motion of her neck.  I doubt meningitis.  At this point, discharge seems appropriate.  She is feeling better after a migraine cocktail.  Patient to be treated with antibiotics for presumed sinusitis based on CT findings and is to return as needed if symptoms worsen or change.  Final Clinical Impression(s) / ED Diagnoses Final diagnoses:  None    Rx / DC Orders ED Discharge Orders    None       Geoffery Lyons, MD 07/25/20 2947

## 2020-07-25 NOTE — ED Triage Notes (Signed)
Pt presents with complaints of severe headache x 3 days. States has taken tylenol and motrin q 8 hours without relief.

## 2021-09-21 ENCOUNTER — Ambulatory Visit (INDEPENDENT_AMBULATORY_CARE_PROVIDER_SITE_OTHER): Payer: Medicaid Other

## 2021-09-21 ENCOUNTER — Ambulatory Visit
Admission: EM | Admit: 2021-09-21 | Discharge: 2021-09-21 | Disposition: A | Discharge status: Home / Self Care | Payer: Medicaid Other | Attending: Physician Assistant | Admitting: Physician Assistant

## 2021-09-21 ENCOUNTER — Encounter: Payer: Self-pay | Admitting: Emergency Medicine

## 2021-09-21 ENCOUNTER — Other Ambulatory Visit: Payer: Self-pay

## 2021-09-21 DIAGNOSIS — S90129A Contusion of unspecified lesser toe(s) without damage to nail, initial encounter: Secondary | ICD-10-CM

## 2021-09-21 DIAGNOSIS — M79672 Pain in left foot: Secondary | ICD-10-CM | POA: Diagnosis not present

## 2021-09-21 NOTE — ED Triage Notes (Signed)
Pt here for 2nd and 3rd toe pain after injuring on pool step yesterday

## 2021-09-21 NOTE — Discharge Instructions (Signed)
Return if any problems.

## 2021-09-22 NOTE — ED Provider Notes (Signed)
[facility]    CSN: [id number] Arrival date & time: 09/21/21  1124      History   Chief Complaint Chief Complaint  Patient presents with   Toe Pain    HPI [name] is a 36 y.o. female.   Pt hit her left toes on the stps of a pool.   Pt complains of bruising and pain,   The history is provided by the patient. No language interpreter was used.  Toe Pain This is a new problem. The current episode started yesterday. The problem occurs constantly. The problem has not changed since onset.Nothing aggravates the symptoms. Nothing relieves the symptoms. She has tried nothing for the symptoms. The treatment provided no relief.    Past Medical History:  Diagnosis Date   Hx of varicella    Medical history non-contributory    No pertinent past medical history     Patient Active Problem List   Diagnosis Date Noted   Pregnancy 09/22/2017   Indication for care in labor or delivery 09/21/2017   Normal labor and delivery 06/15/2014   SVD (spontaneous vaginal delivery) 06/15/2014    Past Surgical History:  Procedure Laterality Date   NO PAST SURGERIES      OB History     Gravida  4   Para  4   Term  4   Preterm  0   AB  0   Living  4      SAB  0   IAB  0   Ectopic  0   Multiple  0   Live Births  4            Home Medications    Prior to Admission medications   Medication Sig Start Date End Date Taking? Authorizing Provider  azithromycin (ZITHROMAX) 250 MG tablet Take 1 tablet (250 mg total) by mouth daily. Patient not taking: Reported on 09/21/2021 07/25/20   Geoffery Lyons, MD  Prenatal Vit-Fe Fumarate-FA (PRENATAL MULTIVITAMIN) TABS Take 1 tablet by mouth daily after lunch.    [provider]    Family History Family History  Family history unknown: Yes    Social History Social History   Tobacco Use   Smoking status: Never   Smokeless tobacco: Never  Substance Use Topics   Alcohol use: No   Drug use: No      Allergies   Shellfish allergy   Review of Systems Review of Systems  All other systems reviewed and are negative.    Physical Exam Triage Vital Signs ED Triage Vitals [09/21/21 1144]  Enc Vitals Group     BP 105/71     Pulse Rate 95     Resp 18     Temp 97.9 F (36.6 C)     Temp Source Oral     SpO2 98 %     Weight      Height      Head Circumference      Peak Flow      Pain Score 6     Pain Loc      Pain Edu?      Excl. in GC?    No data found.  Updated Vital Signs BP 105/71 (BP Location: Left Arm)   Pulse 95   Temp 97.9 F (36.6 C) (Oral)   Resp 18   SpO2 98%   Visual Acuity Right Eye Distance:   Left Eye Distance:   Bilateral Distance:    Right Eye Near:  Left Eye Near:    Bilateral Near:     Physical Exam Vitals and nursing note reviewed.  Constitutional:      Appearance: She is well-developed.  HENT:     Head: Normocephalic.  Pulmonary:     Effort: Pulmonary effort is normal.  Abdominal:     General: There is no distension.  Musculoskeletal:        General: Swelling and tenderness present. Normal range of motion.     Cervical back: Normal range of motion.     Comments: Bruised toes,  tender to touch,  nv and ns intact   Neurological:     Mental Status: She is alert and oriented to person, place, and time.  Psychiatric:        Mood and Affect: Mood normal.     UC Treatments / Results  Labs (all labs ordered are listed, but only abnormal results are displayed) Labs Reviewed - No data to display  EKG   Radiology DG Foot Complete Left  Result Date: 09/21/2021 CLINICAL DATA:  Left foot injury at the pool yesterday. EXAM: LEFT FOOT - COMPLETE 3+ VIEW COMPARISON:  None Available. FINDINGS: There is no evidence of fracture or dislocation. There is no evidence of arthropathy or other focal bone abnormality. Soft tissues are unremarkable. IMPRESSION: Negative. Electronically Signed   By: Obie Dredge M.D.   On: 09/21/2021 12:05     Procedures Procedures (including critical care time)  Medications Ordered in UC Medications - No data to display  Initial Impression / Assessment and Plan / UC Course  I have reviewed the triage vital signs and the nursing notes.  Pertinent labs & imaging results that were available during my care of the patient were reviewed by me and considered in my medical decision making (see chart for details).     MDM  Xray  no fracture Final Clinical Impressions(s) / UC Diagnoses   Final diagnoses:  Contusion of toe without damage to nail, unspecified laterality, unspecified toe, initial encounter     Discharge Instructions      Return if any problems.    ED Prescriptions   None    PDMP not reviewed this encounter. An After Visit Summary was printed and given to the patient.    Elson Areas, [place] 09/22/21 [phone]

## 2023-02-08 ENCOUNTER — Encounter: Payer: Medicaid Other | Admitting: Obstetrics & Gynecology

## 2023-02-10 ENCOUNTER — Encounter: Payer: Self-pay | Admitting: *Deleted

## 2023-02-16 ENCOUNTER — Other Ambulatory Visit (HOSPITAL_COMMUNITY)
Admission: RE | Admit: 2023-02-16 | Discharge: 2023-02-16 | Disposition: A | Discharge status: Home / Self Care | Payer: Medicaid Other | Source: Ambulatory Visit | Attending: Obstetrics and Gynecology | Admitting: Obstetrics and Gynecology

## 2023-02-16 ENCOUNTER — Ambulatory Visit: Payer: Medicaid Other | Admitting: Obstetrics and Gynecology

## 2023-02-16 VITALS — BP 95/62 | HR 80 | Ht 62.0 in | Wt 143.0 lb

## 2023-02-16 DIAGNOSIS — Z113 Encounter for screening for infections with a predominantly sexual mode of transmission: Secondary | ICD-10-CM | POA: Diagnosis not present

## 2023-02-16 DIAGNOSIS — Z01419 Encounter for gynecological examination (general) (routine) without abnormal findings: Secondary | ICD-10-CM | POA: Diagnosis not present

## 2023-02-16 DIAGNOSIS — Z1151 Encounter for screening for human papillomavirus (HPV): Secondary | ICD-10-CM | POA: Diagnosis not present

## 2023-02-16 MED ORDER — NORGESTIMATE-ETH ESTRADIOL 0.25-35 MG-MCG PO TABS: 1.0000 | ORAL_TABLET | Freq: Every day | ORAL | 0 refills | Status: AC

## 2023-02-16 NOTE — Progress Notes (Signed)
GYNECOLOGY ANNUAL PREVENTATIVE CARE ENCOUNTER NOTE  History:     [name] is a 37 y.o. [phone] female here for a routine annual gynecologic exam.  Current complaints: None.   Denies abnormal vaginal bleeding, discharge, pelvic pain, problems with intercourse or other gynecologic concerns.  Gynecologic History Patient's last menstrual period was 02/02/2023. Contraception: IUD copper  Last Pap: unsure. Last Mammogram: NA  Obstetric History OB History  Gravida Para Term Preterm AB Living  4 4 4  0 0 4  SAB IAB Ectopic Multiple Live Births  0 0 0 0 4    # Outcome Date GA Lbr Len/2nd Weight Sex Type Anes PTL Lv  4 Term 09/21/17 [redacted]w[redacted]d 00:44 / 00:22 3.056 kg F Vag-Spont None  LIV     Birth Comments: WNL  3 Term 06/15/14 [redacted]w[redacted]d / 00:36 3.521 kg M Vag-Spont EPI  LIV  2 Term 06/06/12 [redacted]w[redacted]d 03:39 / 01:56 3.1 kg M Vag-Spont EPI  LIV  1 Term 12/28/10 [redacted]w[redacted]d 15:45 / 00:37 2.895 kg F Vag-Spont EPI  LIV     Birth Comments: none    Past Medical History:  Diagnosis Date   Hx of varicella    Medical history non-contributory    No pertinent past medical history     Past Surgical History:  Procedure Laterality Date   NO PAST SURGERIES      No current outpatient medications on file prior to visit.   No current facility-administered medications on file prior to visit.    Allergies  Allergen Reactions   Shellfish Allergy Nausea And Vomiting and Other (See Comments)    Social History:  reports that she has never smoked. She has never used smokeless tobacco. She reports that she does not drink alcohol and does not use drugs.  Family History  Family history unknown: Yes    The following portions of the patient's history were reviewed and updated as appropriate: allergies, current medications, past family history, past medical history, past social history, past surgical history and problem list.  Review of Systems Pertinent items noted in HPI and remainder of comprehensive  ROS otherwise negative.  Physical Exam:  BP 95/62   Pulse 80   Ht 5\' 2"  (1.575 m)   Wt 64.9 kg   LMP 02/02/2023   BMI 26.16 kg/m  CONSTITUTIONAL: Well-developed, well-nourished female in no acute distress.  HENT:  Normocephalic, atraumatic, External right and left ear normal.  EYES: Conjunctivae and EOM are normal. Pupils are equal, round, and reactive to light. No scleral icterus.  NECK: Normal range of motion, supple, no masses.  Normal thyroid.  SKIN: Skin is warm and dry. No rash noted. Not diaphoretic. No erythema. No pallor. MUSCULOSKELETAL: Normal range of motion. No tenderness.  No cyanosis, clubbing, or edema. NEUROLOGIC: Alert and oriented to person, place, and time. Normal reflexes, muscle tone coordination.  PSYCHIATRIC: Normal mood and affect. Normal behavior. Normal judgment and thought content. CARDIOVASCULAR: Normal heart rate noted, regular rhythm RESPIRATORY: Clear to auscultation bilaterally. Effort and breath sounds normal, no problems with respiration noted. BREASTS: Symmetric in size. No masses, tenderness, skin changes, nipple drainage, or lymphadenopathy bilaterally. Performed in the presence of a chaperone. ABDOMEN: Soft, no distention noted.  No tenderness, rebound or guarding.  PELVIC: Normal appearing external genitalia and urethral meatus; normal appearing vaginal mucosa and cervix.  No abnormal vaginal discharge noted.  Pap smear obtained.  Normal uterine size, no other palpable masses, no uterine or adnexal tenderness.  Performed in the presence  of a chaperone.   Assessment and Plan:    1. Well woman exam  - Cytology - PAP( Erma) - Patient is going to the [place] later this month for religious reasons. Does not want to be on her menstrual cylcle while there.  Rx: Sprintec. Take 2 pills every day 1 week before cycle starts and 2 pills during cycle.   2. Screening examination for STI  - Cytology - PAP( [facility])  Will follow up results of  pap smear and manage accordingly. Normal breast examination today, she was advised to perform periodic self breast examinations.  Routine preventative health maintenance measures emphasized. Please refer to After Visit Summary for other counseling recommendations.    Makarios Madlock, Harolyn Rutherford, NP [facility] [employer], [facility]

## 2023-02-18 LAB — CYTOLOGY - PAP
Chlamydia: NEGATIVE
Comment: NEGATIVE
Comment: NEGATIVE
Comment: NEGATIVE
Comment: NORMAL
Diagnosis: NEGATIVE
High risk HPV: NEGATIVE
Neisseria Gonorrhea: NEGATIVE
Trichomonas: NEGATIVE

## 2023-05-12 ENCOUNTER — Other Ambulatory Visit: Payer: Self-pay | Admitting: Obstetrics and Gynecology

## 2023-07-16 ENCOUNTER — Ambulatory Visit: Admitting: Family Medicine

## 2023-07-16 ENCOUNTER — Encounter: Payer: Self-pay | Admitting: Family Medicine

## 2023-07-16 NOTE — Progress Notes (Signed)
 Patient did not keep appointment today. She may call to reschedule.

## 2023-08-05 NOTE — Progress Notes (Signed)
   IUD Removal Procedure Note   Patient is 38 y.o. [id number] who is here for  IUD removal. She has had no issues with IUD. She understands that she could get pregnant after removal of IUD if she does not use another form of contraception. She has no other complaints today. Reviewed risks of removal including pain, bleeding, difficult removal and inability to remove IUD which may require surgical removal in OR. She affirms that she would like IUD removed.  BP 111/75   Pulse 73   Wt 143 lb (64.9 kg)   LMP 07/29/2023 (Exact Date)   BMI 26.16 kg/m   Patient with normal appearing external female genitalia. Graves speculum placed in vagina and  IUD strings easily visualized. Strings grasped with ring forceps and removed easily. No bleeding noted. All instruments removed from vagina. Patient tolerated procedure very well.    She was given post removal instructions. She is not planning to use contraception.  Return 1 year for annual or prn.    Salomon Cree, MSN, CNM 08/06/2023 10:55 AM

## 2023-08-06 ENCOUNTER — Ambulatory Visit: Admitting: Certified Nurse Midwife

## 2023-08-06 VITALS — BP 111/75 | HR 73 | Wt 143.0 lb

## 2023-08-06 DIAGNOSIS — Z30432 Encounter for removal of intrauterine contraceptive device: Secondary | ICD-10-CM
